# Patient Record
Sex: Female | Born: 2000 | Race: Black or African American | Hispanic: No | Marital: Single | Smoking: Never smoker
Health system: Southern US, Community
[De-identification: ages and names within clinical notes are randomized; demographics above are authoritative.]

## PROBLEM LIST (undated history)

## (undated) DIAGNOSIS — O24419 Gestational diabetes mellitus in pregnancy, unspecified control: Secondary | ICD-10-CM

## (undated) HISTORY — PX: OTHER SURGICAL HISTORY: SHX169

## (undated) HISTORY — DX: Gestational diabetes mellitus in pregnancy, unspecified control: O24.419

---
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
## 2019-01-31 NOTE — L&D Delivery Note (Signed)
° ° °     Delivery Note   Kathleen Barnes is a 19 y.o. G1P0 at 107w0d Estimated Date of Delivery: 11/25/19  PRE-OPERATIVE DIAGNOSIS:  1) [redacted]w[redacted]d pregnancy.  2) Spontaneous labor  POST-OPERATIVE DIAGNOSIS:  1) [redacted]w[redacted]d pregnancy s/p Vaginal, Spontaneous  2) Same with viable infant  Delivery Type: Vaginal, Spontaneous    Delivery Anesthesia: Other   Labor Complications:      ESTIMATED BLOOD LOSS: 150  ml    FINDINGS:   1) female infant, Apgar scores of    at 1 minute and    at 5 minutes and a birthweight of   ounces.    2) Nuchal cord: No  SPECIMENS:   PLACENTA:   Appearance:     Removal: Spontaneous      Disposition:    DISPOSITION:  Infant to left in stable condition in the delivery room, with L&D personnel and mother,  NARRATIVE SUMMARY: Labor course:  [name] is a G1P0 at [redacted]w[redacted]d who presented for labor management.  She progressed well in labor with pitocin.  After her contractions became more regular she proceeded to complete dilation. She evidenced good maternal expulsive effort during the second stage. She went on to deliver a viable infant. The placenta delivered without problems and was noted to be complete. A perineal and vaginal examination was performed. Episiotomy/Lacerations: None  The patient tolerated this well.  Elonda Husky, M.D. 11/04/2019 8:07 PM

## 2019-04-24 ENCOUNTER — Ambulatory Visit (INDEPENDENT_AMBULATORY_CARE_PROVIDER_SITE_OTHER): Payer: No Typology Code available for payment source | Admitting: Certified Nurse Midwife

## 2019-04-24 ENCOUNTER — Other Ambulatory Visit: Payer: Self-pay

## 2019-04-24 VITALS — BP 108/60 | HR 89 | Ht 59.0 in

## 2019-04-24 DIAGNOSIS — Z0283 Encounter for blood-alcohol and blood-drug test: Secondary | ICD-10-CM

## 2019-04-24 DIAGNOSIS — Z113 Encounter for screening for infections with a predominantly sexual mode of transmission: Secondary | ICD-10-CM

## 2019-04-24 DIAGNOSIS — Z3401 Encounter for supervision of normal first pregnancy, first trimester: Secondary | ICD-10-CM

## 2019-04-24 LAB — OB RESULTS CONSOLE VARICELLA ZOSTER ANTIBODY, IGG: Varicella: NON-IMMUNE/NOT IMMUNE

## 2019-04-24 LAB — OB RESULTS CONSOLE GC/CHLAMYDIA: Gonorrhea: NEGATIVE

## 2019-04-24 NOTE — Patient Instructions (Signed)
WHAT OB PATIENTS CAN EXPECT   Confirmation of pregnancy and ultrasound ordered if medically indicated-[redacted] weeks gestation  New OB (NOB) intake with nurse and New OB (NOB) labs- [redacted] weeks gestation  New OB (NOB) physical examination with provider- 11/[redacted] weeks gestation  Flu vaccine-[redacted] weeks gestation  Anatomy scan-[redacted] weeks gestation  Glucose tolerance test, blood work to test for anemia, T-dap vaccine-[redacted] weeks gestation  Vaginal swabs/cultures-STD/Group B strep-[redacted] weeks gestation  Appointments every 4 weeks until 28 weeks  Every 2 weeks from 28 weeks until 36 weeks  Weekly visits from 36 weeks until delivery  Second Trimester of Pregnancy  The second trimester is from week 14 through week 27 (month 4 through 6). This is often the time in pregnancy that you feel your best. Often times, morning sickness has lessened or quit. You may have more energy, and you may get hungry more often. Your unborn baby is growing rapidly. At the end of the sixth month, he or she is about 9 inches long and weighs about 1 pounds. You will likely feel the baby move between 18 and 20 weeks of pregnancy. Follow these instructions at home: Medicines  Take over-the-counter and prescription medicines only as told by your doctor. Some medicines are safe and some medicines are not safe during pregnancy.  Take a prenatal vitamin that contains at least 600 micrograms (mcg) of folic acid.  If you have trouble pooping (constipation), take medicine that will make your stool soft (stool softener) if your doctor approves. Eating and drinking   Eat regular, healthy meals.  Avoid raw meat and uncooked cheese.  If you get low calcium from the food you eat, talk to your doctor about taking a daily calcium supplement.  Avoid foods that are high in fat and sugars, such as fried and sweet foods.  If you feel sick to your stomach (nauseous) or throw up (vomit): ? Eat 4 or 5 small meals a day instead of 3 large  meals. ? Try eating a few soda crackers. ? Drink liquids between meals instead of during meals.  To prevent constipation: ? Eat foods that are high in fiber, like fresh fruits and vegetables, whole grains, and beans. ? Drink enough fluids to keep your pee (urine) clear or pale yellow. Activity  Exercise only as told by your doctor. Stop exercising if you start to have cramps.  Do not exercise if it is too hot, too humid, or if you are in a place of great height (high altitude).  Avoid heavy lifting.  Wear low-heeled shoes. Sit and stand up straight.  You can continue to have sex unless your doctor tells you not to. Relieving pain and discomfort  Wear a good support bra if your breasts are tender.  Take warm water baths (sitz baths) to soothe pain or discomfort caused by hemorrhoids. Use hemorrhoid cream if your doctor approves.  Rest with your legs raised if you have leg cramps or low back pain.  If you develop puffy, bulging veins (varicose veins) in your legs: ? Wear support hose or compression stockings as told by your doctor. ? Raise (elevate) your feet for 15 minutes, 3-4 times a day. ? Limit salt in your food. Prenatal care  Write down your questions. Take them to your prenatal visits.  Keep all your prenatal visits as told by your doctor. This is important. Safety  Wear your seat belt when driving.  Make a list of emergency phone numbers, including numbers for family, friends, the  hospital, and police and fire departments. General instructions  Ask your doctor about the right foods to eat or for help finding a counselor, if you need these services.  Ask your doctor about local prenatal classes. Begin classes before month 6 of your pregnancy.  Do not use hot tubs, steam rooms, or saunas.  Do not douche or use tampons or scented sanitary pads.  Do not cross your legs for long periods of time.  Visit your dentist if you have not done so. Use a soft toothbrush  to brush your teeth. Floss gently.  Avoid all smoking, herbs, and alcohol. Avoid drugs that are not approved by your doctor.  Do not use any products that contain nicotine or tobacco, such as cigarettes and e-cigarettes. If you need help quitting, ask your doctor.  Avoid cat litter boxes and soil used by cats. These carry germs that can cause birth defects in the baby and can cause a loss of your baby (miscarriage) or stillbirth. Contact a doctor if:  You have mild cramps or pressure in your lower belly.  You have pain when you pee (urinate).  You have bad smelling fluid coming from your vagina.  You continue to feel sick to your stomach (nauseous), throw up (vomit), or have watery poop (diarrhea).  You have a nagging pain in your belly area.  You feel dizzy. Get help right away if:  You have a fever.  You are leaking fluid from your vagina.  You have spotting or bleeding from your vagina.  You have severe belly cramping or pain.  You lose or gain weight rapidly.  You have trouble catching your breath and have chest pain.  You notice sudden or extreme puffiness (swelling) of your face, hands, ankles, feet, or legs.  You have not felt the baby move in over an hour.  You have severe headaches that do not go away when you take medicine.  You have trouble seeing. Summary  The second trimester is from week 14 through week 27 (months 4 through 6). This is often the time in pregnancy that you feel your best.  To take care of yourself and your unborn baby, you will need to eat healthy meals, take medicines only if your doctor tells you to do so, and do activities that are safe for you and your baby.  Call your doctor if you get sick or if you notice anything unusual about your pregnancy. Also, call your doctor if you need help with the right food to eat, or if you want to know what activities are safe for you. This information is not intended to replace advice given to you by  your health care provider. Make sure you discuss any questions you have with your health care provider. Document Revised: 05/10/2018 Document Reviewed: 02/22/2016 Elsevier Patient Education  [facility] [facility]. Morning Sickness  Morning sickness is when you feel sick to your stomach (nauseous) during pregnancy. You may feel sick to your stomach and throw up (vomit). You may feel sick in the morning, but you can feel this way at any time of day. Some women feel very sick to their stomach and cannot stop throwing up (hyperemesis gravidarum). Follow these instructions at home: Medicines  Take over-the-counter and prescription medicines only as told by your doctor. Do not take any medicines until you talk with your doctor about them first.  Taking multivitamins before getting pregnant can stop or lessen the harshness of morning sickness. Eating and drinking  Eat  dry toast or crackers before getting out of bed.  Eat 5 or 6 small meals a day.  Eat dry and bland foods like rice and baked potatoes.  Do not eat greasy, fatty, or spicy foods.  Have someone cook for you if the smell of food causes you to feel sick or throw up.  If you feel sick to your stomach after taking prenatal vitamins, take them at night or with a snack.  Eat protein when you need a snack. Nuts, yogurt, and cheese are good choices.  Drink fluids throughout the day.  Try ginger ale made with real ginger, ginger tea made from fresh grated ginger, or ginger candies. General instructions  Do not use any products that have nicotine or tobacco in them, such as cigarettes and e-cigarettes. If you need help quitting, ask your doctor.  Use an air purifier to keep the air in your house free of smells.  Get lots of fresh air.  Try to avoid smells that make you feel sick.  Try: ? Wearing a bracelet that is used for seasickness (acupressure wristband). ? Going to a doctor who puts thin needles into certain body points  (acupuncture) to improve how you feel. Contact a doctor if:  You need medicine to feel better.  You feel dizzy or light-headed.  You are losing weight. Get help right away if:  You feel very sick to your stomach and cannot stop throwing up.  You pass out (faint).  You have very bad pain in your belly. Summary  Morning sickness is when you feel sick to your stomach (nauseous) during pregnancy.  You may feel sick in the morning, but you can feel this way at any time of day.  Making some changes to what you eat may help your symptoms go away. This information is not intended to replace advice given to you by your health care provider. Make sure you discuss any questions you have with your health care provider. Document Revised: 12/29/2016 Document Reviewed: 02/17/2016 Elsevier Patient Education  2020 [employer]. How a Baby Grows During Pregnancy  Pregnancy begins when a female's sperm enters a female's egg (fertilization). Fertilization usually happens in one of the tubes (fallopian tubes) that connect the ovaries to the womb (uterus). The fertilized egg moves down the fallopian tube to the uterus. Once it reaches the uterus, it implants into the lining of the uterus and begins to grow. For the first 10 weeks, the fertilized egg is called an embryo. After 10 weeks, it is called a fetus. As the fetus continues to grow, it receives oxygen and nutrients through tissue (placenta) that grows to support the developing baby. The placenta is the life support system for the baby. It provides oxygen and nutrition and removes waste. Learning as much as you can about your pregnancy and how your baby is developing can help you enjoy the experience. It can also make you aware of when there might be a problem and when to ask questions. How long does a typical pregnancy last? A pregnancy usually lasts 280 days, or about 40 weeks. Pregnancy is divided into three periods of growth, also called  trimesters:  First trimester: 0-12 weeks.  Second trimester: 13-27 weeks.  Third trimester: 28-40 weeks. The day when your baby is ready to be born (full term) is your estimated date of delivery. How does my baby develop month by month? First month  The fertilized egg attaches to the inside of the uterus.  Some cells  will form the placenta. Others will form the fetus.  The arms, legs, brain, spinal cord, lungs, and heart begin to develop.  At the end of the first month, the heart begins to beat. Second month  The bones, inner ear, eyelids, hands, and feet form.  The genitals develop.  By the end of 8 weeks, all major organs are developing. Third month  All of the internal organs are forming.  Teeth develop below the gums.  Bones and muscles begin to grow. The spine can flex.  The skin is transparent.  Fingernails and toenails begin to form.  Arms and legs continue to grow longer, and hands and feet develop.  The fetus is about 3 inches (7.6 cm) long. Fourth month  The placenta is completely formed.  The external sex organs, neck, outer ear, eyebrows, eyelids, and fingernails are formed.  The fetus can hear, swallow, and move its arms and legs.  The kidneys begin to produce urine.  The skin is covered with a white, waxy coating (vernix) and very fine hair (lanugo). Fifth month  The fetus moves around more and can be felt for the first time (quickening).  The fetus starts to sleep and wake up and may begin to suck its finger.  The nails grow to the end of the fingers.  The organ in the digestive system that makes bile (gallbladder) functions and helps to digest nutrients.  If your baby is a girl, eggs are present in her ovaries. If your baby is a boy, testicles start to move down into his scrotum. Sixth month  The lungs are formed.  The eyes open. The brain continues to develop.  Your baby has fingerprints and toe prints. Your baby's hair grows  thicker.  At the end of the second trimester, the fetus is about 9 inches (22.9 cm) long. Seventh month  The fetus kicks and stretches.  The eyes are developed enough to sense changes in light.  The hands can make a grasping motion.  The fetus responds to sound. Eighth month  All organs and body systems are fully developed and functioning.  Bones harden, and taste buds develop. The fetus may hiccup.  Certain areas of the brain are still developing. The skull remains soft. Ninth month  The fetus gains about  lb (0.23 kg) each week.  The lungs are fully developed.  Patterns of sleep develop.  The fetus's head typically moves into a head-down position (vertex) in the uterus to prepare for birth.  The fetus weighs 6-9 lb (2.72-4.08 kg) and is 19-20 inches (48.26-50.8 cm) long. What can I do to have a healthy pregnancy and help my baby develop? General instructions  Take prenatal vitamins as directed by your health care provider. These include vitamins such as folic acid, iron, calcium, and vitamin D. They are important for healthy development.  Take medicines only as directed by your health care provider. Read labels and ask a pharmacist or your health care provider whether over-the-counter medicines, supplements, and prescription drugs are safe to take during pregnancy.  Keep all follow-up visits as directed by your health care provider. This is important. Follow-up visits include prenatal care and screening tests. How do I know if my baby is developing well? At each prenatal visit, your health care provider will do several different tests to check on your health and keep track of your baby's development. These include:  Fundal height and position. ? Your health care provider will measure your growing belly from your  pubic bone to the top of the uterus using a tape measure. ? Your health care provider will also feel your belly to determine your baby's  position.  Heartbeat. ? An ultrasound in the first trimester can confirm pregnancy and show a heartbeat, depending on how far along you are. ? Your health care provider will check your baby's heart rate at every prenatal visit.  Second trimester ultrasound. ? This ultrasound checks your baby's development. It also may show your baby's gender. What should I do if I have concerns about my baby's development? Always talk with your health care provider about any concerns that you may have about your pregnancy and your baby. Summary  A pregnancy usually lasts 280 days, or about 40 weeks. Pregnancy is divided into three periods of growth, also called trimesters.  Your health care provider will monitor your baby's growth and development throughout your pregnancy.  Follow your health care provider's recommendations about taking prenatal vitamins and medicines during your pregnancy.  Talk with your health care provider if you have any concerns about your pregnancy or your developing baby. This information is not intended to replace advice given to you by your health care provider. Make sure you discuss any questions you have with your health care provider. Document Revised: 05/09/2018 Document Reviewed: 11/29/2016 Elsevier Patient Education  2020 [place] of Pregnancy  The first trimester of pregnancy is from week 1 until the end of week 13 (months 1 through 3). During this time, your baby will begin to develop inside you. At 6-8 weeks, the eyes and face are formed, and the heartbeat can be seen on ultrasound. At the end of 12 weeks, all the baby's organs are formed. Prenatal care is all the medical care you receive before the birth of your baby. Make sure you get good prenatal care and follow all of your doctor's instructions. Follow these instructions at home: Medicines  Take over-the-counter and prescription medicines only as told by your doctor. Some medicines are safe  and some medicines are not safe during pregnancy.  Take a prenatal vitamin that contains at least 600 micrograms (mcg) of folic acid.  If you have trouble pooping (constipation), take medicine that will make your stool soft (stool softener) if your doctor approves. Eating and drinking   Eat regular, healthy meals.  Your doctor will tell you the amount of weight gain that is right for you.  Avoid raw meat and uncooked cheese.  If you feel sick to your stomach (nauseous) or throw up (vomit): ? Eat 4 or 5 small meals a day instead of 3 large meals. ? Try eating a few soda crackers. ? Drink liquids between meals instead of during meals.  To prevent constipation: ? Eat foods that are high in fiber, like fresh fruits and vegetables, whole grains, and beans. ? Drink enough fluids to keep your pee (urine) clear or pale yellow. Activity  Exercise only as told by your doctor. Stop exercising if you have cramps or pain in your lower belly (abdomen) or low back.  Do not exercise if it is too hot, too humid, or if you are in a place of great height (high altitude).  Try to avoid standing for long periods of time. Move your legs often if you must stand in one place for a long time.  Avoid heavy lifting.  Wear low-heeled shoes. Sit and stand up straight.  You can have sex unless your doctor tells you not to. Relieving pain  and discomfort  Wear a good support bra if your breasts are sore.  Take warm water baths (sitz baths) to soothe pain or discomfort caused by hemorrhoids. Use hemorrhoid cream if your doctor says it is okay.  Rest with your legs raised if you have leg cramps or low back pain.  If you have puffy, bulging veins (varicose veins) in your legs: ? Wear support hose or compression stockings as told by your doctor. ? Raise (elevate) your feet for 15 minutes, 3-4 times a day. ? Limit salt in your food. Prenatal care  Schedule your prenatal visits by the twelfth week of  pregnancy.  Write down your questions. Take them to your prenatal visits.  Keep all your prenatal visits as told by your doctor. This is important. Safety  Wear your seat belt at all times when driving.  Make a list of emergency phone numbers. The list should include numbers for family, friends, the hospital, and police and fire departments. General instructions  Ask your doctor for a referral to a local prenatal class. Begin classes no later than at the start of month 6 of your pregnancy.  Ask for help if you need counseling or if you need help with nutrition. Your doctor can give you advice or tell you where to go for help.  Do not use hot tubs, steam rooms, or saunas.  Do not douche or use tampons or scented sanitary pads.  Do not cross your legs for long periods of time.  Avoid all herbs and alcohol. Avoid drugs that are not approved by your doctor.  Do not use any tobacco products, including cigarettes, chewing tobacco, and electronic cigarettes. If you need help quitting, ask your doctor. You may get counseling or other support to help you quit.  Avoid cat litter boxes and soil used by cats. These carry germs that can cause birth defects in the baby and can cause a loss of your baby (miscarriage) or stillbirth.  Visit your dentist. At home, brush your teeth with a soft toothbrush. Be gentle when you floss. Contact a doctor if:  You are dizzy.  You have mild cramps or pressure in your lower belly.  You have a nagging pain in your belly area.  You continue to feel sick to your stomach, you throw up, or you have watery poop (diarrhea).  You have a bad smelling fluid coming from your vagina.  You have pain when you pee (urinate).  You have increased puffiness (swelling) in your face, hands, legs, or ankles. Get help right away if:  You have a fever.  You are leaking fluid from your vagina.  You have spotting or bleeding from your vagina.  You have very bad belly  cramping or pain.  You gain or lose weight rapidly.  You throw up blood. It may look like coffee grounds.  You are around people who have [place] measles, fifth disease, or chickenpox.  You have a very bad headache.  You have shortness of breath.  You have any kind of trauma, such as from a fall or a car accident. Summary  The first trimester of pregnancy is from week 1 until the end of week 13 (months 1 through 3).  To take care of yourself and your unborn baby, you will need to eat healthy meals, take medicines only if your doctor tells you to do so, and do activities that are safe for you and your baby.  Keep all follow-up visits as told by your  doctor. This is important as your doctor will have to ensure that your baby is healthy and growing well. This information is not intended to replace advice given to you by your health care provider. Make sure you discuss any questions you have with your health care provider. Document Revised: 05/09/2018 Document Reviewed: 01/25/2016 Elsevier Patient Education  2020 [employer]. Commonly Asked Questions During Pregnancy  Cats: A parasite can be excreted in cat feces.  To avoid exposure you need to have another person empty the little box.  If you must empty the litter box you will need to wear gloves.  Wash your hands after handling your cat.  This parasite can also be found in raw or undercooked meat so this should also be avoided.  Colds, Sore Throats, Flu: Please check your medication sheet to see what you can take for symptoms.  If your symptoms are unrelieved by these medications please call the office.  Dental Work: Most any dental work [employer] recommends is permitted.  X-rays should only be taken during the first trimester if absolutely necessary.  Your abdomen should be shielded with a lead apron during all x-rays.  Please notify your provider prior to receiving any x-rays.  Novocaine is fine; gas is not recommended.  If your  dentist requires a note from [place] prior to dental work please call the office and we will provide one for you.  Exercise: Exercise is an important part of staying healthy during your pregnancy.  You may continue most exercises you were accustomed to prior to pregnancy.  Later in your pregnancy you will most likely notice you have difficulty with activities requiring balance like riding a bicycle.  It is important that you listen to your body and avoid activities that put you at a higher risk of falling.  Adequate rest and staying well hydrated are a must!  If you have questions about the safety of specific activities ask your provider.    Exposure to Children with illness: Try to avoid obvious exposure; report any symptoms to [place] when noted,  If you have chicken pos, red measles or mumps, you should be immune to these diseases.   Please do not take any vaccines while pregnant unless you have checked with your OB provider.  Fetal Movement: After 28 weeks we recommend you do "kick counts" twice daily.  Lie or sit down in a calm quiet environment and count your baby movements "kicks".  You should feel your baby at least 10 times per hour.  If you have not felt 10 kicks within the first hour get up, walk around and have something sweet to eat or drink then repeat for an additional hour.  If count remains less than 10 per hour notify your provider.  Fumigating: Follow your pest control agent's advice as to how long to stay out of your home.  Ventilate the area well before re-entering.  Hemorrhoids:   Most over-the-counter preparations can be used during pregnancy.  Check your medication to see what is safe to use.  It is important to use a stool softener or fiber in your diet and to drink lots of liquids.  If hemorrhoids seem to be getting worse please call the office.   Hot Tubs:  Hot tubs Jacuzzis and saunas are not recommended while pregnant.  These increase your internal body temperature and should be  avoided.  Intercourse:  Sexual intercourse is safe during pregnancy as long as you are comfortable, unless otherwise advised by  your provider.  Spotting may occur after intercourse; report any bright red bleeding that is heavier than spotting.  Labor:  If you know that you are in labor, please go to the hospital.  If you are unsure, please call the office and let us help you decide what to do.  Lifting, straining, etc:  If your job requires heavy lifting or straining please check with your provider for any limitations.  Generally, you should not lift items heavier than that you can lift simply with your hands and arms (no back muscles)  Painting:  Paint fumes do not harm your pregnancy, but may make you ill and should be avoided if possible.  Latex or water based paints have less odor than oils.  Use adequate ventilation while painting.  Permanents & Hair Color:  Chemicals in hair dyes are not recommended as they cause increase hair dryness which can increase hair loss during pregnancy.  " Highlighting" and permanents are allowed.  Dye may be absorbed differently and permanents may not hold as well during pregnancy.  Sunbathing:  Use a sunscreen, as skin burns easily during pregnancy.  Drink plenty of fluids; avoid over heating.  Tanning Beds:  Because their possible side effects are still unknown, tanning beds are not recommended.  Ultrasound Scans:  Routine ultrasounds are performed at approximately 20 weeks.  You will be able to see your baby's general anatomy an if you would like to know the gender this can usually be determined as well.  If it is questionable when you conceived you may also receive an ultrasound early in your pregnancy for dating purposes.  Otherwise ultrasound exams are not routinely performed unless there is a medical necessity.  Although you can request a scan we ask that you pay for it when conducted because insurance does not cover " patient request" scans.  Work: If your  pregnancy proceeds without complications you may work until your due date, unless your physician or employer advises otherwise.  Round Ligament Pain/Pelvic Discomfort:  Sharp, shooting pains not associated with bleeding are fairly common, usually occurring in the second trimester of pregnancy.  They tend to be worse when standing up or when you remain standing for long periods of time.  These are the result of pressure of certain pelvic ligaments called "round ligaments".  Rest, Tylenol and heat seem to be the most effective relief.  As the womb and fetus grow, they rise out of the pelvis and the discomfort improves.  Please notify the office if your pain seems different than that described.  It may represent a more serious condition.  Common Medications Safe in Pregnancy  Acne:      Constipation:  Benzoyl Peroxide     Colace  Clindamycin      Dulcolax Suppository  Topica Erythromycin     Fibercon  Salicylic Acid      Metamucil         Miralax AVOID:        Senakot   Accutane    Cough:  Retin-A       Cough Drops  Tetracycline      Phenergan w/ Codeine if Rx  Minocycline      Robitussin (Plain & DM)  Antibiotics:     Crabs/Lice:  Ceclor       RID  Cephalosporins    AVOID:  E-Mycins      Kwell  Keflex  Macrobid/Macrodantin   Diarrhea:  Penicillin      Kao-Pectate  Zithromax  Imodium AD         PUSH FLUIDS AVOID:       Cipro     Fever:  Tetracycline      Tylenol (Regular or Extra  Minocycline       Strength)  Levaquin      Extra Strength-Do not          Exceed 8 tabs/24 hrs Caffeine:        '200mg'$ /day (equiv. To 1 cup of coffee or  approx. 3 12 oz sodas)         Gas: Cold/Hayfever:       Gas-X  Benadryl      Mylicon  Claritin       Phazyme  **Claritin-D        Chlor-Trimeton    Headaches:  Dimetapp      ASA-Free Excedrin  Drixoral-Non-Drowsy     Cold Compress  Mucinex (Guaifenasin)     Tylenol (Regular or Extra  Sudafed/Sudafed-12 Hour     Strength)  **Sudafed PE  Pseudoephedrine   Tylenol Cold & Sinus     Vicks Vapor Rub  Zyrtec  **AVOID if Problems With Blood Pressure         Heartburn: Avoid lying down for at least 1 hour after meals  Aciphex      Maalox     Rash:  Milk of Magnesia     Benadryl    Mylanta       1% Hydrocortisone Cream  Pepcid  Pepcid Complete   Sleep Aids:  Prevacid      Ambien   Prilosec       Benadryl  Rolaids       Chamomile Tea  Tums (Limit 4/day)     Unisom  Zantac       Tylenol PM         Warm milk-add vanilla or  Hemorrhoids:       Sugar for taste  Anusol/Anusol H.C.  (RX: Analapram 2.5%)  Sugar Substitutes:  Hydrocortisone OTC     Ok in moderation  Preparation H      Tucks        Vaseline lotion applied to tissue with wiping    Herpes:     Throat:  Acyclovir      Oragel  Famvir  Valtrex     Vaccines:         Flu Shot Leg Cramps:       *Gardasil  Benadryl      Hepatitis A         Hepatitis B Nasal Spray:       Pneumovax  Saline Nasal Spray     Polio Booster         Tetanus Nausea:       Tuberculosis test or PPD  Vitamin B6 25 mg TID   AVOID:    Dramamine      *Gardasil  Emetrol       Live Poliovirus  Ginger Root 250 mg QID    MMR (measles, mumps &  High Complex Carbs @ Bedtime    rebella)  Sea Bands-Accupressure    Varicella (Chickenpox)  Unisom 1/2 tab TID     *No known complications           If received before Pain:         Known pregnancy;   Darvocet       Resume series after  Lortab        Delivery  Percocet  Yeast:   Tramadol      Femstat  Tylenol 3      Gyne-lotrimin  Ultram       Monistat  Vicodin           MISC:         All Sunscreens           Hair Coloring/highlights          Insect Repellant's          (Including DEET)         Mystic Tans

## 2019-04-24 NOTE — Progress Notes (Signed)
      Kathleen Barnes presents for NOB nurse intake visit. Pregnancy confirmation done at [facility],04/09/2019, with unknown.  G1.  P0.  LMP 02/18/2019.  EDD10/26/2021.  Ga [redacted]w[redacted]d. Pregnancy education material explained and given.  0 cats in the home.  NOB labs ordered. BMI less than 30. TSH/HbgA1c not ordered. Sickle cell order due to race. HIV and drug screen explained and ordered. Genetic screening discussed. Genetic testing; Unsure would like to talk it over with family. Pt to discuss genetic testing with provider. PNV encouraged. Pt to follow up with provider in 3 weeks for NOB physical and this week for u/s dating and viability.   BP 108/60   Pulse 89   Ht 4\' 11"  (1.499 m)   LMP 02/18/2019   BMI 29.29 kg/m

## 2019-04-24 NOTE — Progress Notes (Signed)
I have reviewed the record and concur with patient management and plan of care.    Gunnar Bulla, CNM [facility], [facility] 04/24/19 1:55 PM

## 2019-04-25 LAB — URINALYSIS, ROUTINE W REFLEX MICROSCOPIC
Bilirubin, UA: NEGATIVE
Glucose, UA: NEGATIVE
Ketones, UA: NEGATIVE
Leukocytes,UA: NEGATIVE
Nitrite, UA: NEGATIVE
Protein,UA: NEGATIVE
RBC, UA: NEGATIVE
Specific Gravity, UA: >=1.03 — AB (ref 1.005–1.030)
Urobilinogen, Ur: 0.2 mg/dL (ref 0.2–1.0)
pH, UA: 6 (ref 5.0–7.5)

## 2019-04-25 LAB — ANTIBODY SCREEN: Antibody Screen: NEGATIVE

## 2019-04-25 LAB — RUBELLA SCREEN: Rubella Antibodies, IGG: 1.48 index (ref >0.99)

## 2019-04-25 LAB — RPR: RPR Ser Ql: NONREACTIVE

## 2019-04-25 LAB — HEPATITIS B SURFACE ANTIGEN: Hepatitis B Surface Ag: NEGATIVE

## 2019-04-25 LAB — ABO AND RH: Rh Factor: POSITIVE

## 2019-04-25 LAB — HIV ANTIBODY (ROUTINE TESTING W REFLEX): HIV Screen 4th Generation wRfx: NONREACTIVE

## 2019-04-25 LAB — HGB SOLU + RFLX FRAC: Sickle Solubility Test - HGBRFX: NEGATIVE

## 2019-04-25 LAB — TOXOPLASMA ANTIBODIES- IGG AND  IGM
Toxoplasma Antibody- IgM: <3 AU/mL (ref 0.0–7.9)
Toxoplasma IgG Ratio: <3 IU/mL (ref 0.0–7.1)

## 2019-04-25 LAB — VARICELLA ZOSTER ANTIBODY, IGG: Varicella zoster IgG: <135 index — ABNORMAL LOW (ref >165)

## 2019-04-26 LAB — DRUG PROFILE, UR, 9 DRUGS (LABCORP)
Amphetamines, Urine: NEGATIVE ng/mL
Barbiturate Quant, Ur: NEGATIVE ng/mL
Benzodiazepine Quant, Ur: NEGATIVE ng/mL
Cannabinoid Quant, Ur: NEGATIVE ng/mL
Cocaine (Metab.): NEGATIVE ng/mL
Methadone Screen, Urine: NEGATIVE ng/mL
Opiate Quant, Ur: NEGATIVE ng/mL
PCP Quant, Ur: NEGATIVE ng/mL
Propoxyphene: NEGATIVE ng/mL

## 2019-04-26 LAB — NICOTINE SCREEN, URINE: Cotinine Ql Scrn, Ur: POSITIVE ng/mL — AB

## 2019-04-26 LAB — CULTURE, OB URINE

## 2019-04-26 LAB — URINE CULTURE, OB REFLEX

## 2019-04-27 ENCOUNTER — Encounter: Payer: Self-pay | Admitting: Obstetrics and Gynecology

## 2019-04-27 LAB — GC/CHLAMYDIA PROBE AMP
Chlamydia trachomatis, NAA: NEGATIVE
Neisseria Gonorrhoeae by PCR: NEGATIVE

## 2019-05-15 ENCOUNTER — Other Ambulatory Visit: Payer: Self-pay

## 2019-05-15 ENCOUNTER — Ambulatory Visit (INDEPENDENT_AMBULATORY_CARE_PROVIDER_SITE_OTHER): Payer: No Typology Code available for payment source

## 2019-05-15 DIAGNOSIS — Z3401 Encounter for supervision of normal first pregnancy, first trimester: Secondary | ICD-10-CM

## 2019-05-16 ENCOUNTER — Ambulatory Visit (INDEPENDENT_AMBULATORY_CARE_PROVIDER_SITE_OTHER): Payer: No Typology Code available for payment source | Admitting: Certified Nurse Midwife

## 2019-05-16 ENCOUNTER — Encounter: Payer: Self-pay | Admitting: Certified Nurse Midwife

## 2019-05-16 VITALS — BP 112/65 | HR 103 | Wt 146.5 lb

## 2019-05-16 DIAGNOSIS — Z3401 Encounter for supervision of normal first pregnancy, first trimester: Secondary | ICD-10-CM

## 2019-05-16 DIAGNOSIS — O09899 Supervision of other high risk pregnancies, unspecified trimester: Secondary | ICD-10-CM

## 2019-05-16 DIAGNOSIS — L818 Other specified disorders of pigmentation: Secondary | ICD-10-CM

## 2019-05-16 DIAGNOSIS — Z283 Underimmunization status: Secondary | ICD-10-CM

## 2019-05-16 DIAGNOSIS — Z2839 Other underimmunization status: Secondary | ICD-10-CM | POA: Insufficient documentation

## 2019-05-16 DIAGNOSIS — Z3A12 12 weeks gestation of pregnancy: Secondary | ICD-10-CM

## 2019-05-16 DIAGNOSIS — Z130 Encounter for screening for diseases of the blood and blood-forming organs and certain disorders involving the immune mechanism: Secondary | ICD-10-CM

## 2019-05-16 HISTORY — DX: Supervision of other high risk pregnancies, unspecified trimester: O09.899

## 2019-05-16 HISTORY — DX: Other underimmunization status: Z28.39

## 2019-05-16 LAB — POCT URINALYSIS DIPSTICK OB
Bilirubin, UA: NEGATIVE
Blood, UA: NEGATIVE
Glucose, UA: NEGATIVE
Ketones, UA: NEGATIVE
Leukocytes, UA: NEGATIVE
Nitrite, UA: NEGATIVE
POC,PROTEIN,UA: NEGATIVE
Spec Grav, UA: >=1.03 — AB (ref 1.010–1.025)
Urobilinogen, UA: 0.2 E.U./dL
pH, UA: 5 (ref 5.0–8.0)

## 2019-05-16 NOTE — Patient Instructions (Signed)
Healthy Weight Gain During Pregnancy, Adult A certain amount of weight gain during pregnancy is normal and healthy. How much weight you should gain depends on your overall health and a measurement called BMI (body mass index). BMI is an estimate of your body fat based on your height and weight. You can use an online calculator to figure out your BMI, or you can ask your health care provider to calculate it for you at your next visit. Your recommended pregnancy weight gain is based on your pre-pregnancy BMI. General guidelines for a healthy total weight gain during pregnancy are listed below. If your BMI at or before the start of your pregnancy is:  Less than 18.5 (underweight), you should gain 28-40 lb (13-18 kg).  18.5-24.9 (normal weight), you should gain 25-35 lb (11-16 kg).  25-29.9 (overweight), you should gain 15-25 lb (7-11 kg).  30 or higher (obese), you should gain 11-20 lb (5-9 kg). These ranges vary depending on your individual health. If you are carrying more than one baby (multiples), it may be safe to gain more weight than these recommendations. If you gain less weight than recommended, that may be safe as long as your baby is growing and developing normally. How can unhealthy weight gain affect me and my baby? Gaining too much weight during pregnancy can lead to pregnancy complications, such as:  A temporary form of diabetes that develops during pregnancy (gestational diabetes).  High blood pressure during pregnancy and protein in your urine (preeclampsia).  High blood pressure during pregnancy without protein in your urine (gestational hypertension).  Your baby having a high weight at birth, which may: ? Raise your risk of having a more difficult delivery or a surgical delivery (cesarean delivery, or C-section). ? Raise your child's risk of developing obesity during childhood. Not gaining enough weight can be life-threatening for your baby, and it may raise your baby's chances  of:  Being born early (preterm).  Growing more slowly than normal during pregnancy (growth restriction).  Having a low weight at birth. What actions can I take to gain a healthy amount of weight during pregnancy? General instructions  Keep track of your weight gain during pregnancy.  Take over-the-counter and prescription medicines only as told by your health care provider. Take all prenatal supplements as directed.  Keep all health care visits during pregnancy (prenatal visits). These visits are a good time to discuss your weight gain. Your health care provider will weigh you at each visit to make sure you are gaining a healthy amount of weight. Nutrition   Eat a balanced, nutrient-rich diet. Eat plenty of: ? Fruits and vegetables, such as berries and broccoli. ? Whole grains, such as millet, barley, whole-wheat breads and cereals, and oatmeal. ? Low-fat dairy products or non-dairy products such as almond milk or rice milk. ? Protein foods, such as lean meat, chicken, eggs, and legumes (such as peas, beans, soybeans, and lentils).  Avoid foods that are fried or have a lot of fat, salt (sodium), or sugar.  Drink enough fluid to keep your urine pale yellow.  Choose healthy snack and drink options when you are at work or on the go: ? Drink water. Avoid soda, sports drinks, and juices that have added sugar. ? Avoid drinks with caffeine, such as coffee and energy drinks. ? Eat snacks that are high in protein, such as nuts, protein bars, and low-fat yogurt. ? Carry convenient snacks in your purse that do not need refrigeration, such as a pack of   trail mix, an apple, or a granola bar.  If you need help improving your diet, work with a health care provider or a diet and nutrition specialist (dietitian). Activity   Exercise regularly, as told by your health care provider. ? If you were active before becoming pregnant, you may be able to continue your regular fitness activities. ? If  you were not active before pregnancy, you may gradually build up to exercising for 30 or more minutes on most days of the week. This may include walking, swimming, or yoga.  Ask your health care provider what activities are safe for you. Talk with your health care provider about whether you may need to be excused from certain school or work activities. Where to find more information Learn more about managing your weight gain during pregnancy from:  American Pregnancy Association: www.americanpregnancy.org  [place] Department of Agriculture pregnancy weight gain calculator: [employer] Summary  Too much weight gain during pregnancy can lead to complications for you and your baby.  Find out your pre-pregnancy BMI to determine how much weight gain is healthy for you.  Eat nutritious foods and stay active.  Keep all of your prenatal visits as told by your health care provider. This information is not intended to replace advice given to you by your health care provider. Make sure you discuss any questions you have with your health care provider. Document Revised: 10/09/2018 Document Reviewed: 10/06/2016 Elsevier Patient Education  [place].   Exercise During Pregnancy Exercise is an important part of being healthy for people of all ages. Exercise improves the function of your heart and lungs and helps you maintain strength, flexibility, and a healthy body weight. Exercise also boosts energy levels and elevates mood. Most women should exercise regularly during pregnancy. In rare cases, women with certain medical conditions or complications may be asked to limit or avoid exercise during pregnancy. How does this affect me? Along with maintaining general strength and flexibility, exercising during pregnancy can help:  Keep strength in muscles that are used during labor and childbirth.  Decrease low back pain.  Reduce symptoms of depression.  Control weight gain during  pregnancy.  Reduce the risk of needing insulin if you develop diabetes during pregnancy.  Decrease the risk of cesarean delivery.  Speed up your recovery after giving birth. How does this affect my baby? Exercise can help you have a healthy pregnancy. Exercise does not cause premature birth. It will not cause your baby to weigh less at birth. What exercises can I do? Many exercises are safe for you to do during pregnancy. Do a variety of exercises that safely increase your heart and breathing rates and help you build and maintain muscle strength. Do exercises exactly as told by your health care provider. You may do these exercises:  Walking or hiking.  Swimming.  Water aerobics.  Riding a stationary bike.  Strength training.  Modified yoga or Pilates. Tell your instructor that you are pregnant. Avoid overstretching, and avoid lying on your back for long periods of time.  Running or jogging. Only choose this type of exercise if you: ? Ran or jogged regularly before your pregnancy. ? Can run or jog and still talk in complete sentences. What exercises should I avoid? Depending on your level of fitness and whether you exercised regularly before your pregnancy, you may be told to limit high-intensity exercise. You can tell that you are exercising at a high intensity if you are breathing much harder and faster and  cannot hold a conversation while exercising. You must avoid:  Contact sports.  Activities that put you at risk for falling on or being hit in the belly, such as downhill skiing, water skiing, surfing, rock climbing, cycling, gymnastics, and horseback riding.  Scuba diving.  Skydiving.  Yoga or Pilates in a room that is heated to high temperatures.  Jogging or running, unless you ran or jogged regularly before your pregnancy. While jogging or running, you should always be able to talk in full sentences. Do not run or jog so fast that you are unable to have a  conversation.  Do not exercise at more than 6,000 feet above sea level (high elevation) if you are not used to exercising at high elevation. How do I exercise in a safe way?   Avoid overheating. Do not exercise in very high temperatures.  Wear loose-fitting, breathable clothes.  Avoid dehydration. Drink enough water before, during, and after exercise to keep your urine pale yellow.  Avoid overstretching. Because of hormone changes during pregnancy, it is easy to overstretch muscles, tendons, and ligaments during pregnancy.  Start slowly and ask your health care provider to recommend the types of exercise that are safe for you.  Do not exercise to lose weight. Follow these instructions at home:  Exercise on most days or all days of the week. Try to exercise for 30 minutes a day, 5 days a week, unless your health care provider tells you not to.  If you actively exercised before your pregnancy and you are healthy, your health care provider may tell you to continue to do moderate to high-intensity exercise.  If you are just starting to exercise or did not exercise much before your pregnancy, your health care provider may tell you to do low to moderate-intensity exercise. Questions to ask your health care provider  Is exercise safe for me?  What are signs that I should stop exercising?  Does my health condition mean that I should not exercise during pregnancy?  When should I avoid exercising during pregnancy? Stop exercising and contact a health care provider if: You have any unusual symptoms, such as:  Mild contractions of the uterus or cramps in the abdomen.  Dizziness that does not go away when you rest. Stop exercising and get help right away if: You have any unusual symptoms, such as:  Sudden, severe pain in your low back or your belly.  Mild contractions of the uterus or cramps in the abdomen that do not improve with rest and drinking fluids.  Chest pain.  Bleeding or  fluid leaking from your vagina.  Shortness of breath. These symptoms may represent a serious problem that is an emergency. Do not wait to see if the symptoms will go away. Get medical help right away. Call your local emergency services (911 in the [place] Do not drive yourself to the hospital. Summary  Most women should exercise regularly throughout pregnancy. In rare cases, women with certain medical conditions or complications may be asked to limit or avoid exercise during pregnancy.  Do not exercise to lose weight during pregnancy.  Your health care provider will tell you what level of physical activity is right for you.  Stop exercising and contact a health care provider if you have mild contractions of the uterus or cramps in the abdomen. Get help right away if these contractions or cramps do not improve with rest and drinking fluids.  Stop exercising and get help right away if you have sudden, severe  pain in your low back or belly, chest pain, shortness of breath, or bleeding or leaking of fluid from your vagina. This information is not intended to replace advice given to you by your health care provider. Make sure you discuss any questions you have with your health care provider. Document Revised: 05/09/2018 Document Reviewed: 02/20/2018 Elsevier Patient Education  [place].   Common Medications Safe in Pregnancy  Acne:      Constipation:  Benzoyl Peroxide     Colace  Clindamycin      Dulcolax Suppository  Topica Erythromycin     Fibercon  Salicylic Acid      Metamucil         Miralax AVOID:        Senakot   Accutane    Cough:  Retin-A       Cough Drops  Tetracycline      Phenergan w/ Codeine if Rx  Minocycline      Robitussin (Plain & DM)  Antibiotics:     Crabs/Lice:  Ceclor       RID  Cephalosporins    AVOID:  E-Mycins      Kwell  Keflex  Macrobid/Macrodantin   Diarrhea:  Penicillin      Kao-Pectate  Zithromax      Imodium AD         PUSH  FLUIDS AVOID:       Cipro     Fever:  Tetracycline      Tylenol (Regular or Extra  Minocycline       Strength)  Levaquin      Extra Strength-Do not          Exceed 8 tabs/24 hrs Caffeine:        <248m/day (equiv. To 1 cup of coffee or  approx. 3 12 oz sodas)         Gas: Cold/Hayfever:       Gas-X  Benadryl      Mylicon  Claritin       Phazyme  **Claritin-D        Chlor-Trimeton    Headaches:  Dimetapp      ASA-Free Excedrin  Drixoral-Non-Drowsy     Cold Compress  Mucinex (Guaifenasin)     Tylenol (Regular or Extra  Sudafed/Sudafed-12 Hour     Strength)  **Sudafed PE Pseudoephedrine   Tylenol Cold & Sinus     Vicks Vapor Rub  Zyrtec  **AVOID if Problems With Blood Pressure         Heartburn: Avoid lying down for at least 1 hour after meals  Aciphex      Maalox     Rash:  Milk of Magnesia     Benadryl    Mylanta       1% Hydrocortisone Cream  Pepcid  Pepcid Complete   Sleep Aids:  Prevacid      Ambien   Prilosec       Benadryl  Rolaids       Chamomile Tea  Tums (Limit 4/day)     Unisom  Zantac       Tylenol PM         Warm milk-add vanilla or  Hemorrhoids:       Sugar for taste  Anusol/Anusol H.C.  (RX: Analapram 2.5%)  Sugar Substitutes:  Hydrocortisone OTC     Ok in moderation  Preparation H      Tucks        Vaseline lotion applied to tissue with wiping  Herpes:     Throat:  Acyclovir      Oragel  Famvir  Valtrex     Vaccines:         Flu Shot Leg Cramps:       *Gardasil  Benadryl      Hepatitis A         Hepatitis B Nasal Spray:       Pneumovax  Saline Nasal Spray     Polio Booster         Tetanus Nausea:       Tuberculosis test or PPD  Vitamin B6 25 mg TID   AVOID:    Dramamine      *Gardasil  Emetrol       Live Poliovirus  Ginger Root 250 mg QID    MMR (measles, mumps &  High Complex Carbs @ Bedtime    rebella)  Sea Bands-Accupressure    Varicella (Chickenpox)  Unisom 1/2 tab TID     *No known complications           If received  before Pain:         Known pregnancy;   Darvocet       Resume series after  Lortab        Delivery  Percocet    Yeast:   Tramadol      Femstat  Tylenol 3      Gyne-lotrimin  Ultram       Monistat  Vicodin           MISC:         All Sunscreens           Hair Coloring/highlights          Insect Repellant's          (Including DEET)         Mystic Tans    Second Trimester of Pregnancy  The second trimester is from week 14 through week 27 (month 4 through 6). This is often the time in pregnancy that you feel your best. Often times, morning sickness has lessened or quit. You may have more energy, and you may get hungry more often. Your unborn baby is growing rapidly. At the end of the sixth month, he or she is about 9 inches long and weighs about 1 pounds. You will likely feel the baby move between 18 and 20 weeks of pregnancy. Follow these instructions at home: Medicines  Take over-the-counter and prescription medicines only as told by your doctor. Some medicines are safe and some medicines are not safe during pregnancy.  Take a prenatal vitamin that contains at least 600 micrograms (mcg) of folic acid.  If you have trouble pooping (constipation), take medicine that will make your stool soft (stool softener) if your doctor approves. Eating and drinking   Eat regular, healthy meals.  Avoid raw meat and uncooked cheese.  If you get low calcium from the food you eat, talk to your doctor about taking a daily calcium supplement.  Avoid foods that are high in fat and sugars, such as fried and sweet foods.  If you feel sick to your stomach (nauseous) or throw up (vomit): ? Eat 4 or 5 small meals a day instead of 3 large meals. ? Try eating a few soda crackers. ? Drink liquids between meals instead of during meals.  To prevent constipation: ? Eat foods that are high in fiber, like fresh fruits and vegetables, whole grains, and beans. ? Drink enough fluids   keep your pee (urine)  clear or pale yellow. Activity  Exercise only as told by your doctor. Stop exercising if you start to have cramps.  Do not exercise if it is too hot, too humid, or if you are in a place of great height (high altitude).  Avoid heavy lifting.  Wear low-heeled shoes. Sit and stand up straight.  You can continue to have sex unless your doctor tells you not to. Relieving pain and discomfort  Wear a good support bra if your breasts are tender.  Take warm water baths (sitz baths) to soothe pain or discomfort caused by hemorrhoids. Use hemorrhoid cream if your doctor approves.  Rest with your legs raised if you have leg cramps or low back pain.  If you develop puffy, bulging veins (varicose veins) in your legs: ? Wear support hose or compression stockings as told by your doctor. ? Raise (elevate) your feet for 15 minutes, 3-4 times a day. ? Limit salt in your food. Prenatal care  Write down your questions. Take them to your prenatal visits.  Keep all your prenatal visits as told by your doctor. This is important. Safety  Wear your seat belt when driving.  Make a list of emergency phone numbers, including numbers for family, friends, the hospital, and police and fire departments. General instructions  Ask your doctor about the right foods to eat or for help finding a counselor, if you need these services.  Ask your doctor about local prenatal classes. Begin classes before month 6 of your pregnancy.  Do not use hot tubs, steam rooms, or saunas.  Do not douche or use tampons or scented sanitary pads.  Do not cross your legs for long periods of time.  Visit your dentist if you have not done so. Use a soft toothbrush to brush your teeth. Floss gently.  Avoid all smoking, herbs, and alcohol. Avoid drugs that are not approved by your doctor.  Do not use any products that contain nicotine or tobacco, such as cigarettes and e-cigarettes. If you need help quitting, ask your  doctor.  Avoid cat litter boxes and soil used by cats. These carry germs that can cause birth defects in the baby and can cause a loss of your baby (miscarriage) or stillbirth. Contact a doctor if:  You have mild cramps or pressure in your lower belly.  You have pain when you pee (urinate).  You have bad smelling fluid coming from your vagina.  You continue to feel sick to your stomach (nauseous), throw up (vomit), or have watery poop (diarrhea).  You have a nagging pain in your belly area.  You feel dizzy. Get help right away if:  You have a fever.  You are leaking fluid from your vagina.  You have spotting or bleeding from your vagina.  You have severe belly cramping or pain.  You lose or gain weight rapidly.  You have trouble catching your breath and have chest pain.  You notice sudden or extreme puffiness (swelling) of your face, hands, ankles, feet, or legs.  You have not felt the baby move in over an hour.  You have severe headaches that do not go away when you take medicine.  You have trouble seeing. Summary  The second trimester is from week 14 through week 27 (months 4 through 6). This is often the time in pregnancy that you feel your best.  To take care of yourself and your unborn baby, you will need to eat healthy meals, take  medicines only if your doctor tells you to do so, and do activities that are safe for you and your baby.  Call your doctor if you get sick or if you notice anything unusual about your pregnancy. Also, call your doctor if you need help with the right food to eat, or if you want to know what activities are safe for you. This information is not intended to replace advice given to you by your health care provider. Make sure you discuss any questions you have with your health care provider. Document Revised: 05/10/2018 Document Reviewed: 02/22/2016 Elsevier Patient Education  [place].

## 2019-05-16 NOTE — Progress Notes (Signed)
NEW OB HISTORY AND PHYSICAL  SUBJECTIVE:       Kathleen Barnes is a 19 y.o. G1P0 female, Patient's last menstrual period was 02/18/2019., Estimated Date of Delivery: 11/25/19, [redacted]w[redacted]d, presents today for establishment of Prenatal Care.  Endorses nausea without vomiting and occasional cramping.   Denies difficulty breathing or respiratory distress, chest pain, abdominal pain, vaginal bleeding, dysuria, and leg pain or swelling.   Desires midwifery care. Declines genetic screening.    Gynecologic History  Patient's last menstrual period was 02/18/2019.   Contraception: none  Last Pap: N/A  Obstetric History  OB History  Gravida Para Term Preterm AB Living  1            SAB TAB Ectopic Multiple Live Births               # Outcome Date GA Lbr Len/2nd Weight Sex Delivery Anes PTL Lv  1 Current             Past Medical History:  Diagnosis Date  . History of chlamydia infection 10/16/2017    Past Surgical History:  Procedure Laterality Date  . no surgical history      Current Outpatient Medications on File Prior to Visit  Medication Sig Dispense Refill  . Prenatal Vit-Fe Fumarate-FA (MULTIVITAMIN-PRENATAL) 27-0.8 MG TABS tablet Take 1 tablet by mouth daily at 12 noon.     No current facility-administered medications on file prior to visit.    No Known Allergies  Social History   Socioeconomic History  . Marital status: Single    Spouse name: Not on file  . Number of children: Not on file  . Years of education: Not on file  . Highest education level: Not on file  Occupational History  . Not on file  Tobacco Use  . Smoking status: Never Smoker  . Smokeless tobacco: Never Used  Substance and Sexual Activity  . Alcohol use: No  . Drug use: No  . Sexual activity: Yes  Other Topics Concern  . Not on file  Social History Narrative  . Not on file   Social Determinants of Health   Financial Resource Strain:   . Difficulty of Paying Living Expenses:   Food  Insecurity:   . Worried About Programme researcher, broadcasting/film/video in the Last Year:   . Barista in the Last Year:   Transportation Needs:   . Freight forwarder (Medical):   Marland Kitchen Lack of Transportation (Non-Medical):   Physical Activity:   . Days of Exercise per Week:   . Minutes of Exercise per Session:   Stress:   . Feeling of Stress :   Social Connections:   . Frequency of Communication with Friends and Family:   . Frequency of Social Gatherings with Friends and Family:   . Attends Religious Services:   . Active Member of Clubs or Organizations:   . Attends Banker Meetings:   Marland Kitchen Marital Status:   Intimate Partner Violence:   . Fear of Current or Ex-Partner:   . Emotionally Abused:   Marland Kitchen Physically Abused:   . Sexually Abused:     Family History  Problem Relation Age of Onset  . Hypertension Mother   . Healthy Father   . Heart attack Maternal Grandmother   . Migraines Maternal Grandmother     The following portions of the patient's history were reviewed and updated as appropriate: allergies, current medications, past OB history, past medical history, past surgical history, past  family history, past social history, and problem list.  Review of Systems:  ROS negative except as noted above. Information obtained from patient.   OBJECTIVE:  BP 112/65   Pulse (!) 103   Wt 146 lb 8 oz (66.5 kg)   LMP 02/18/2019   BMI 29.59 kg/m   Initial Physical Exam (New OB)  GENERAL APPEARANCE: alert, well appearing, in no apparent distress  HEAD: normocephalic, atraumatic  MOUTH: mucous membranes moist, pharynx normal without lesions  THYROID: no thyromegaly or masses present  BREASTS: no masses noted, no significant tenderness, no palpable axillary nodes, no skin changes  LUNGS: clear to auscultation, no wheezes, rales or rhonchi, symmetric air entry  HEART: regular rate and rhythm, no murmurs  ABDOMEN: soft, nontender, nondistended, no abnormal masses, no  epigastric pain and FHT present  EXTREMITIES: no redness or tenderness in the calves or thighs, no edema  SKIN: normal coloration and turgor, no rashes, professional and unprofessional tattoos present  LYMPH NODES: no adenopathy palpable  NEUROLOGIC: alert, oriented, normal speech, no focal findings or movement disorder noted  PELVIC EXAM: deferred, no complaints  ASSESSMENT: Normal/Teen pregnancy Rh positive Varicella non-immune Desires midwifery care Declines genetic screening  PLAN: Prenatal care New OB counseling: The patient has been given an overview regarding routine prenatal care. Recommendations regarding diet, weight gain, and exercise in pregnancy were given. Prenatal testing, optional genetic testing, and ultrasound use in pregnancy were reviewed.  Benefits of Breast Feeding were discussed. The patient is encouraged to consider nursing her baby post partum. See orders

## 2019-05-17 LAB — CBC
Hematocrit: 37.6 % (ref 34.0–46.6)
Hemoglobin: 12.8 g/dL (ref 11.1–15.9)
MCH: 28.2 pg (ref 26.6–33.0)
MCHC: 34 g/dL (ref 31.5–35.7)
MCV: 83 fL (ref 79–97)
Platelets: 226 10*3/uL (ref 150–450)
RBC: 4.54 x10E6/uL (ref 3.77–5.28)
RDW: 13.4 % (ref 11.7–15.4)
WBC: 4.1 10*3/uL (ref 3.4–10.8)

## 2019-05-17 LAB — HCV COMMENT:

## 2019-05-17 LAB — HEPATITIS C ANTIBODY (REFLEX): HCV Ab: <0.1 s/co ratio (ref 0.0–0.9)

## 2019-06-13 NOTE — Progress Notes (Signed)
ROB-Pt present for routine prenatal care 

## 2019-06-13 NOTE — Patient Instructions (Signed)
Second Trimester of Pregnancy  The second trimester is from week 14 through week 27 (month 4 through 6). This is often the time in pregnancy that you feel your best. Often times, morning sickness has lessened or quit. You may have more energy, and you may get hungry more often. Your unborn baby is growing rapidly. At the end of the sixth month, he or she is about 9 inches long and weighs about 1 pounds. You will likely feel the baby move between 18 and 20 weeks of pregnancy. Follow these instructions at home: Medicines  Take over-the-counter and prescription medicines only as told by your doctor. Some medicines are safe and some medicines are not safe during pregnancy.  Take a prenatal vitamin that contains at least 600 micrograms (mcg) of folic acid.  If you have trouble pooping (constipation), take medicine that will make your stool soft (stool softener) if your doctor approves. Eating and drinking   Eat regular, healthy meals.  Avoid raw meat and uncooked cheese.  If you get low calcium from the food you eat, talk to your doctor about taking a daily calcium supplement.  Avoid foods that are high in fat and sugars, such as fried and sweet foods.  If you feel sick to your stomach (nauseous) or throw up (vomit): ? Eat 4 or 5 small meals a day instead of 3 large meals. ? Try eating a few soda crackers. ? Drink liquids between meals instead of during meals.  To prevent constipation: ? Eat foods that are high in fiber, like fresh fruits and vegetables, whole grains, and beans. ? Drink enough fluids to keep your pee (urine) clear or pale yellow. Activity  Exercise only as told by your doctor. Stop exercising if you start to have cramps.  Do not exercise if it is too hot, too humid, or if you are in a place of great height (high altitude).  Avoid heavy lifting.  Wear low-heeled shoes. Sit and stand up straight.  You can continue to have sex unless your doctor tells you not  to. Relieving pain and discomfort  Wear a good support bra if your breasts are tender.  Take warm water baths (sitz baths) to soothe pain or discomfort caused by hemorrhoids. Use hemorrhoid cream if your doctor approves.  Rest with your legs raised if you have leg cramps or low back pain.  If you develop puffy, bulging veins (varicose veins) in your legs: ? Wear support hose or compression stockings as told by your doctor. ? Raise (elevate) your feet for 15 minutes, 3-4 times a day. ? Limit salt in your food. Prenatal care  Write down your questions. Take them to your prenatal visits.  Keep all your prenatal visits as told by your doctor. This is important. Safety  Wear your seat belt when driving.  Make a list of emergency phone numbers, including numbers for family, friends, the hospital, and police and fire departments. General instructions  Ask your doctor about the right foods to eat or for help finding a counselor, if you need these services.  Ask your doctor about local prenatal classes. Begin classes before month 6 of your pregnancy.  Do not use hot tubs, steam rooms, or saunas.  Do not douche or use tampons or scented sanitary pads.  Do not cross your legs for long periods of time.  Visit your dentist if you have not done so. Use a soft toothbrush to brush your teeth. Floss gently.  Avoid all smoking, herbs,   and alcohol. Avoid drugs that are not approved by your doctor.  Do not use any products that contain nicotine or tobacco, such as cigarettes and e-cigarettes. If you need help quitting, ask your doctor.  Avoid cat litter boxes and soil used by cats. These carry germs that can cause birth defects in the baby and can cause a loss of your baby (miscarriage) or stillbirth. Contact a doctor if:  You have mild cramps or pressure in your lower belly.  You have pain when you pee (urinate).  You have bad smelling fluid coming from your vagina.  You continue to  feel sick to your stomach (nauseous), throw up (vomit), or have watery poop (diarrhea).  You have a nagging pain in your belly area.  You feel dizzy. Get help right away if:  You have a fever.  You are leaking fluid from your vagina.  You have spotting or bleeding from your vagina.  You have severe belly cramping or pain.  You lose or gain weight rapidly.  You have trouble catching your breath and have chest pain.  You notice sudden or extreme puffiness (swelling) of your face, hands, ankles, feet, or legs.  You have not felt the baby move in over an hour.  You have severe headaches that do not go away when you take medicine.  You have trouble seeing. Summary  The second trimester is from week 14 through week 27 (months 4 through 6). This is often the time in pregnancy that you feel your best.  To take care of yourself and your unborn baby, you will need to eat healthy meals, take medicines only if your doctor tells you to do so, and do activities that are safe for you and your baby.  Call your doctor if you get sick or if you notice anything unusual about your pregnancy. Also, call your doctor if you need help with the right food to eat, or if you want to know what activities are safe for you. This information is not intended to replace advice given to you by your health care provider. Make sure you discuss any questions you have with your health care provider. Document Revised: 05/10/2018 Document Reviewed: 02/22/2016 Elsevier Patient Education  [place]. Common Medications Safe in Pregnancy  Acne:      Constipation:  Benzoyl Peroxide     Colace  Clindamycin      Dulcolax Suppository  Topica Erythromycin     Fibercon  Salicylic Acid      Metamucil         Miralax AVOID:        Senakot   Accutane    Cough:  Retin-A       Cough Drops  Tetracycline      Phenergan w/ Codeine if Rx  Minocycline      Robitussin (Plain &  DM)  Antibiotics:     Crabs/Lice:  Ceclor       RID  Cephalosporins    AVOID:  E-Mycins      Kwell  Keflex  Macrobid/Macrodantin   Diarrhea:  Penicillin      Kao-Pectate  Zithromax      Imodium AD         PUSH FLUIDS AVOID:       Cipro     Fever:  Tetracycline      Tylenol (Regular or Extra  Minocycline       Strength)  Levaquin      Extra Strength-Do not  Exceed 8 tabs/24 hrs Caffeine:        <235m/day (equiv. To 1 cup of coffee or  approx. 3 12 oz sodas)         Gas: Cold/Hayfever:       Gas-X  Benadryl      Mylicon  Claritin       Phazyme  **Claritin-D        Chlor-Trimeton    Headaches:  Dimetapp      ASA-Free Excedrin  Drixoral-Non-Drowsy     Cold Compress  Mucinex (Guaifenasin)     Tylenol (Regular or Extra  Sudafed/Sudafed-12 Hour     Strength)  **Sudafed PE Pseudoephedrine   Tylenol Cold & Sinus     Vicks Vapor Rub  Zyrtec  **AVOID if Problems With Blood Pressure         Heartburn: Avoid lying down for at least 1 hour after meals  Aciphex      Maalox     Rash:  Milk of Magnesia     Benadryl    Mylanta       1% Hydrocortisone Cream  Pepcid  Pepcid Complete   Sleep Aids:  Prevacid      Ambien   Prilosec       Benadryl  Rolaids       Chamomile Tea  Tums (Limit 4/day)     Unisom  Zantac       Tylenol PM         Warm milk-add vanilla or  Hemorrhoids:       Sugar for taste  Anusol/Anusol H.C.  (RX: Analapram 2.5%)  Sugar Substitutes:  Hydrocortisone OTC     Ok in moderation  Preparation H      Tucks        Vaseline lotion applied to tissue with wiping    Herpes:     Throat:  Acyclovir      Oragel  Famvir  Valtrex     Vaccines:         Flu Shot Leg Cramps:       *Gardasil  Benadryl      Hepatitis A         Hepatitis B Nasal Spray:       Pneumovax  Saline Nasal Spray     Polio Booster         Tetanus Nausea:       Tuberculosis test or PPD  Vitamin B6 25 mg TID   AVOID:    Dramamine      *Gardasil  Emetrol       Live  Poliovirus  Ginger Root 250 mg QID    MMR (measles, mumps &  High Complex Carbs @ Bedtime    rebella)  Sea Bands-Accupressure    Varicella (Chickenpox)  Unisom 1/2 tab TID     *No known complications           If received before Pain:         Known pregnancy;   Darvocet       Resume series after  Lortab        Delivery  Percocet    Yeast:   Tramadol      Femstat  Tylenol 3      Gyne-lotrimin  Ultram       Monistat  Vicodin           MISC:         All Sunscreens  Hair Coloring/highlights          Insect Repellant's          (Including DEET)         Mystic Tans Breastfeeding  Choosing to breastfeed is one of the best decisions you can make for yourself and your baby. A change in hormones during pregnancy causes your breasts to make breast milk in your milk-producing glands. Hormones prevent breast milk from being released before your baby is born. They also prompt milk flow after birth. Once breastfeeding has begun, thoughts of your baby, as well as his or her sucking or crying, can stimulate the release of milk from your milk-producing glands. Benefits of breastfeeding Research shows that breastfeeding offers many health benefits for infants and mothers. It also offers a cost-free and convenient way to feed your baby. For your baby  Your first milk (colostrum) helps your baby's digestive system to function better.  Special cells in your milk (antibodies) help your baby to fight off infections.  Breastfed babies are less likely to develop asthma, allergies, obesity, or type 2 diabetes. They are also at lower risk for sudden infant death syndrome (SIDS).  Nutrients in breast milk are better able to meet your baby's needs compared to infant formula.  Breast milk improves your baby's brain development. For you  Breastfeeding helps to create a very special bond between you and your baby.  Breastfeeding is convenient. Breast milk costs nothing and is always available at the  correct temperature.  Breastfeeding helps to burn calories. It helps you to lose the weight that you gained during pregnancy.  Breastfeeding makes your uterus return faster to its size before pregnancy. It also slows bleeding (lochia) after you give birth.  Breastfeeding helps to lower your risk of developing type 2 diabetes, osteoporosis, rheumatoid arthritis, cardiovascular disease, and breast, ovarian, uterine, and endometrial cancer later in life. Breastfeeding basics Starting breastfeeding  Find a comfortable place to sit or lie down, with your neck and back well-supported.  Place a pillow or a rolled-up blanket under your baby to bring him or her to the level of your breast (if you are seated). Nursing pillows are specially designed to help support your arms and your baby while you breastfeed.  Make sure that your baby's tummy (abdomen) is facing your abdomen.  Gently massage your breast. With your fingertips, massage from the outer edges of your breast inward toward the nipple. This encourages milk flow. If your milk flows slowly, you may need to continue this action during the feeding.  Support your breast with 4 fingers underneath and your thumb above your nipple (make the letter "C" with your hand). Make sure your fingers are well away from your nipple and your baby's mouth.  Stroke your baby's lips gently with your finger or nipple.  When your baby's mouth is open wide enough, quickly bring your baby to your breast, placing your entire nipple and as much of the areola as possible into your baby's mouth. The areola is the colored area around your nipple. ? More areola should be visible above your baby's upper lip than below the lower lip. ? Your baby's lips should be opened and extended outward (flanged) to ensure an adequate, comfortable latch. ? Your baby's tongue should be between his or her lower gum and your breast.  Make sure that your baby's mouth is correctly positioned  around your nipple (latched). Your baby's lips should create a seal on your breast and be turned   out (everted).  It is common for your baby to suck about 2-3 minutes in order to start the flow of breast milk. Latching Teaching your baby how to latch onto your breast properly is very important. An improper latch can cause nipple pain, decreased milk supply, and poor weight gain in your baby. Also, if your baby is not latched onto your nipple properly, he or she may swallow some air during feeding. This can make your baby fussy. Burping your baby when you switch breasts during the feeding can help to get rid of the air. However, teaching your baby to latch on properly is still the best way to prevent fussiness from swallowing air while breastfeeding. Signs that your baby has successfully latched onto your nipple  Silent tugging or silent sucking, without causing you pain. Infant's lips should be extended outward (flanged).  Swallowing heard between every 3-4 sucks once your milk has started to flow (after your let-down milk reflex occurs).  Muscle movement above and in front of his or her ears while sucking. Signs that your baby has not successfully latched onto your nipple  Sucking sounds or smacking sounds from your baby while breastfeeding.  Nipple pain. If you think your baby has not latched on correctly, slip your finger into the corner of your baby's mouth to break the suction and place it between your baby's gums. Attempt to start breastfeeding again. Signs of successful breastfeeding Signs from your baby  Your baby will gradually decrease the number of sucks or will completely stop sucking.  Your baby will fall asleep.  Your baby's body will relax.  Your baby will retain a small amount of milk in his or her mouth.  Your baby will let go of your breast by himself or herself. Signs from you  Breasts that have increased in firmness, weight, and size 1-3 hours after  feeding.  Breasts that are softer immediately after breastfeeding.  Increased milk volume, as well as a change in milk consistency and color by the fifth day of breastfeeding.  Nipples that are not sore, cracked, or bleeding. Signs that your baby is getting enough milk  Wetting at least 1-2 diapers during the first 24 hours after birth.  Wetting at least 5-6 diapers every 24 hours for the first week after birth. The urine should be clear or pale yellow by the age of 5 days.  Wetting 6-8 diapers every 24 hours as your baby continues to grow and develop.  At least 3 stools in a 24-hour period by the age of 5 days. The stool should be soft and yellow.  At least 3 stools in a 24-hour period by the age of 7 days. The stool should be seedy and yellow.  No loss of weight greater than 10% of birth weight during the first 3 days of life.  Average weight gain of 4-7 oz (113-198 g) per week after the age of 4 days.  Consistent daily weight gain by the age of 5 days, without weight loss after the age of 2 weeks. After a feeding, your baby may spit up a small amount of milk. This is normal. Breastfeeding frequency and duration Frequent feeding will help you make more milk and can prevent sore nipples and extremely full breasts (breast engorgement). Breastfeed when you feel the need to reduce the fullness of your breasts or when your baby shows signs of hunger. This is called "breastfeeding on demand." Signs that your baby is hungry include:  Increased alertness, activity,  or restlessness.  Movement of the head from side to side.  Opening of the mouth when the corner of the mouth or cheek is stroked (rooting).  Increased sucking sounds, smacking lips, cooing, sighing, or squeaking.  Hand-to-mouth movements and sucking on fingers or hands.  Fussing or crying. Avoid introducing a pacifier to your baby in the first 4-6 weeks after your baby is born. After this time, you may choose to use a  pacifier. Research has shown that pacifier use during the first year of a baby's life decreases the risk of sudden infant death syndrome (SIDS). Allow your baby to feed on each breast as long as he or she wants. When your baby unlatches or falls asleep while feeding from the first breast, offer the second breast. Because newborns are often sleepy in the first few weeks of life, you may need to awaken your baby to get him or her to feed. Breastfeeding times will vary from baby to baby. However, the following rules can serve as a guide to help you make sure that your baby is properly fed:  Newborns (babies 40 weeks of age or younger) may breastfeed every 1-3 hours.  Newborns should not go without breastfeeding for longer than 3 hours during the day or 5 hours during the night.  You should breastfeed your baby a minimum of 8 times in a 24-hour period. Breast milk pumping     Pumping and storing breast milk allows you to make sure that your baby is exclusively fed your breast milk, even at times when you are unable to breastfeed. This is especially important if you go back to work while you are still breastfeeding, or if you are not able to be present during feedings. Your lactation consultant can help you find a method of pumping that works best for you and give you guidelines about how long it is safe to store breast milk. Caring for your breasts while you breastfeed Nipples can become dry, cracked, and sore while breastfeeding. The following recommendations can help keep your breasts moisturized and healthy:  Avoid using soap on your nipples.  Wear a supportive bra designed especially for nursing. Avoid wearing underwire-style bras or extremely tight bras (sports bras).  Air-dry your nipples for 3-4 minutes after each feeding.  Use only cotton bra pads to absorb leaked breast milk. Leaking of breast milk between feedings is normal.  Use lanolin on your nipples after breastfeeding. Lanolin  helps to maintain your skin's normal moisture barrier. Pure lanolin is not harmful (not toxic) to your baby. You may also hand express a few drops of breast milk and gently massage that milk into your nipples and allow the milk to air-dry. In the first few weeks after giving birth, some women experience breast engorgement. Engorgement can make your breasts feel heavy, warm, and tender to the touch. Engorgement peaks within 3-5 days after you give birth. The following recommendations can help to ease engorgement:  Completely empty your breasts while breastfeeding or pumping. You may want to start by applying warm, moist heat (in the shower or with warm, water-soaked hand towels) just before feeding or pumping. This increases circulation and helps the milk flow. If your baby does not completely empty your breasts while breastfeeding, pump any extra milk after he or she is finished.  Apply ice packs to your breasts immediately after breastfeeding or pumping, unless this is too uncomfortable for you. To do this: ? Put ice in a plastic bag. ? Place a  towel between your skin and the bag. ? Leave the ice on for 20 minutes, 2-3 times a day.  Make sure that your baby is latched on and positioned properly while breastfeeding. If engorgement persists after 48 hours of following these recommendations, contact your health care provider or a Science writer. Overall health care recommendations while breastfeeding  Eat 3 healthy meals and 3 snacks every day. Well-nourished mothers who are breastfeeding need an additional 450-500 calories a day. You can meet this requirement by increasing the amount of a balanced diet that you eat.  Drink enough water to keep your urine pale yellow or clear.  Rest often, relax, and continue to take your prenatal vitamins to prevent fatigue, stress, and low vitamin and mineral levels in your body (nutrient deficiencies).  Do not use any products that contain nicotine or  tobacco, such as cigarettes and e-cigarettes. Your baby may be harmed by chemicals from cigarettes that pass into breast milk and exposure to secondhand smoke. If you need help quitting, ask your health care provider.  Avoid alcohol.  Do not use illegal drugs or marijuana.  Talk with your health care provider before taking any medicines. These include over-the-counter and prescription medicines as well as vitamins and herbal supplements. Some medicines that may be harmful to your baby can pass through breast milk.  It is possible to become pregnant while breastfeeding. If birth control is desired, ask your health care provider about options that will be safe while breastfeeding your baby. Where to find more information: [employer]: www.llli.org Contact a health care provider if:  You feel like you want to stop breastfeeding or have become frustrated with breastfeeding.  Your nipples are cracked or bleeding.  Your breasts are red, tender, or warm.  You have: ? Painful breasts or nipples. ? A swollen area on either breast. ? A fever or chills. ? Nausea or vomiting. ? Drainage other than breast milk from your nipples.  Your breasts do not become full before feedings by the fifth day after you give birth.  You feel sad and depressed.  Your baby is: ? Too sleepy to eat well. ? Having trouble sleeping. ? More than 19 week old and wetting fewer than 6 diapers in a 24-hour period. ? Not gaining weight by 61 days of age.  Your baby has fewer than 3 stools in a 24-hour period.  Your baby's skin or the white parts of his or her eyes become yellow. Get help right away if:  Your baby is overly tired (lethargic) and does not want to wake up and feed.  Your baby develops an unexplained fever. Summary  Breastfeeding offers many health benefits for infant and mothers.  Try to breastfeed your infant when he or she shows early signs of hunger.  Gently tickle or stroke  your baby's lips with your finger or nipple to allow the baby to open his or her mouth. Bring the baby to your breast. Make sure that much of the areola is in your baby's mouth. Offer one side and burp the baby before you offer the other side.  Talk with your health care provider or lactation consultant if you have questions or you face problems as you breastfeed. This information is not intended to replace advice given to you by your health care provider. Make sure you discuss any questions you have with your health care provider. Document Revised: 04/12/2017 Document Reviewed: 02/18/2016 Elsevier Patient Education  [place].

## 2019-06-16 ENCOUNTER — Ambulatory Visit (INDEPENDENT_AMBULATORY_CARE_PROVIDER_SITE_OTHER): Payer: No Typology Code available for payment source | Admitting: Certified Nurse Midwife

## 2019-06-16 ENCOUNTER — Other Ambulatory Visit: Payer: Self-pay

## 2019-06-16 ENCOUNTER — Encounter: Payer: Self-pay | Admitting: Certified Nurse Midwife

## 2019-06-16 VITALS — BP 109/72 | HR 96 | Wt 151.4 lb

## 2019-06-16 DIAGNOSIS — Z6830 Body mass index (BMI) 30.0-30.9, adult: Secondary | ICD-10-CM

## 2019-06-16 DIAGNOSIS — Z3402 Encounter for supervision of normal first pregnancy, second trimester: Secondary | ICD-10-CM

## 2019-06-16 DIAGNOSIS — Z3A16 16 weeks gestation of pregnancy: Secondary | ICD-10-CM

## 2019-06-16 LAB — POCT URINALYSIS DIPSTICK OB
Bilirubin, UA: NEGATIVE
Blood, UA: NEGATIVE
Glucose, UA: NEGATIVE
Ketones, UA: NEGATIVE
Leukocytes, UA: NEGATIVE
Nitrite, UA: NEGATIVE
POC,PROTEIN,UA: NEGATIVE
Spec Grav, UA: 1.025 (ref 1.010–1.025)
Urobilinogen, UA: 0.2 E.U./dL
pH, UA: 5 (ref 5.0–8.0)

## 2019-06-16 NOTE — Progress Notes (Signed)
ROB doing well. Feel good movement. Discussed anatomy scan next visit. Body mass index is 30.59 kg/m. Hem A1c today and genetic screening test. Pt has decided to have it done. Will follow up with results. States that the nausea is resolving. Follow up 4 wks with Marcelino Duster.   Doreene Burke, CNM

## 2019-06-17 LAB — HEMOGLOBIN A1C
Est. average glucose Bld gHb Est-mCnc: 114 mg/dL
Hgb A1c MFr Bld: 5.6 % (ref 4.8–5.6)

## 2019-07-17 ENCOUNTER — Ambulatory Visit (INDEPENDENT_AMBULATORY_CARE_PROVIDER_SITE_OTHER): Payer: No Typology Code available for payment source | Admitting: Certified Nurse Midwife

## 2019-07-17 ENCOUNTER — Ambulatory Visit (INDEPENDENT_AMBULATORY_CARE_PROVIDER_SITE_OTHER): Payer: No Typology Code available for payment source

## 2019-07-17 ENCOUNTER — Other Ambulatory Visit: Payer: Self-pay

## 2019-07-17 VITALS — BP 104/70 | HR 92 | Wt 159.2 lb

## 2019-07-17 DIAGNOSIS — Z3A21 21 weeks gestation of pregnancy: Secondary | ICD-10-CM

## 2019-07-17 DIAGNOSIS — Z3402 Encounter for supervision of normal first pregnancy, second trimester: Secondary | ICD-10-CM | POA: Diagnosis not present

## 2019-07-17 DIAGNOSIS — O283 Abnormal ultrasonic finding on antenatal screening of mother: Secondary | ICD-10-CM

## 2019-07-17 DIAGNOSIS — O43199 Other malformation of placenta, unspecified trimester: Secondary | ICD-10-CM | POA: Insufficient documentation

## 2019-07-17 HISTORY — DX: Abnormal ultrasonic finding on antenatal screening of mother: O28.3

## 2019-07-17 HISTORY — DX: Other malformation of placenta, unspecified trimester: O43.199

## 2019-07-17 LAB — POCT URINALYSIS DIPSTICK OB
Bilirubin, UA: NEGATIVE
Blood, UA: NEGATIVE
Glucose, UA: NEGATIVE
Ketones, UA: NEGATIVE
Leukocytes, UA: NEGATIVE
Nitrite, UA: NEGATIVE
POC,PROTEIN,UA: NEGATIVE
Spec Grav, UA: 1.02 (ref 1.010–1.025)
Urobilinogen, UA: 0.2 E.U./dL
pH, UA: 5 (ref 5.0–8.0)

## 2019-07-17 NOTE — Patient Instructions (Addendum)

## 2019-07-17 NOTE — Progress Notes (Signed)
ROB-Doing well. Has no questions or concerns today. Reviewed anatomy scan with patient.Discussed Echogenic Foci and handout provided and marginal cord insertion. Reviewed Panorama screening with patient  Discussed follow up growth ultrasound at 28 weeks. All questions answered. Anticipatory guidance give. Reviewed red flags and when to call the office.  RTC x 4 weeks for ROB then RTC x 8 weeks for ROB/Growth ultrasound / 28 week labs or sooner if needed.  Glorious Peach RN [facility] 07/17/19 9:59 AM

## 2019-07-17 NOTE — Progress Notes (Signed)
ULTRASOUND REPORT  Location: [facility] Date of Service: 07/17/2019   Indications:Anatomy Ultrasound Findings:  Singleton intrauterine pregnancy is visualized with FHR at 160 BPM. Biometrics give an (U/S) Gestational age of [redacted]w[redacted]d and an (U/S) EDD of 12/03/2019; this correlates with the clinically established Estimated Date of Delivery: 11/25/19  Fetal presentation is Variable.  EFW: 338 g ( 12 oz).  Placenta: posterior. Grade: 1 , Maginal cord insertion AFI: subjectively normal.  Anatomic survey is complete and normal; Gender - female.    Two echogenic foci with-in the fetal heart measuring 2 mm each.  Right Ovary is normal in appearance. Left Ovary is normal appearance. Survey of the adnexa demonstrates no adnexal masses. There is no free peritoneal fluid in the cul de sac.  Impression: 1. [redacted]w[redacted]d Viable Singleton Intrauterine pregnancy by U/S. 2. (U/S) EDD is consistent with Clinically established Estimated Date of Delivery: 11/25/19 . 3. Two echogenic foci with-in the fetal heart measuring 2 mm each. 4. Marginal cord insertion.  Recommendations: 1.Clinical correlation with the patient's History and Physical Exam.    I have seen, interviewed, and examined the patient in conjunction with the [employer] Nurse Practitioner student and affirm the diagnosis and management plan.   Gunnar Bulla, CNM [facility], [facility] 07/17/19 12:43 PM

## 2019-07-31 ENCOUNTER — Telehealth: Payer: Self-pay

## 2019-07-31 ENCOUNTER — Telehealth: Payer: Self-pay | Admitting: Certified Nurse Midwife

## 2019-07-31 NOTE — Telephone Encounter (Signed)
mychart message sent to patient

## 2019-07-31 NOTE — Telephone Encounter (Signed)
This patient called in stating she is having some congestion and thinks she may have a common cold. Given that the patient is pregnant she would like a call from provider on what she can or cant take/do. Could you please advise?

## 2019-08-08 IMAGING — CT CT RENAL STONE PROTOCOL
3 of 4 series · 8 of 46 positions shown, 15 images · non-contrast
Comparison: None.

CLINICAL DATA: Right flank pain for 1 week

EXAM:
CT ABDOMEN AND PELVIS WITHOUT CONTRAST
TECHNIQUE: Multidetector CT imaging of the abdomen and pelvis was performed
following the standard protocol without IV contrast.

[Series 4: lung bases · axial · 0.64mm/px · z∈[-57,+3]mm · 4 of 20 slices shown, 9 images]
[im 4/20  soft-tissue]
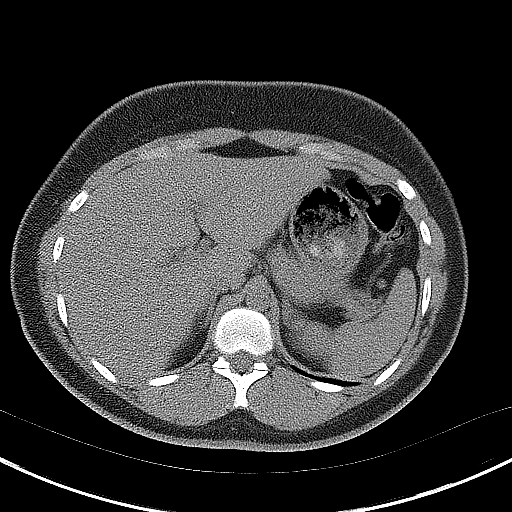
[im 4/20  lung]
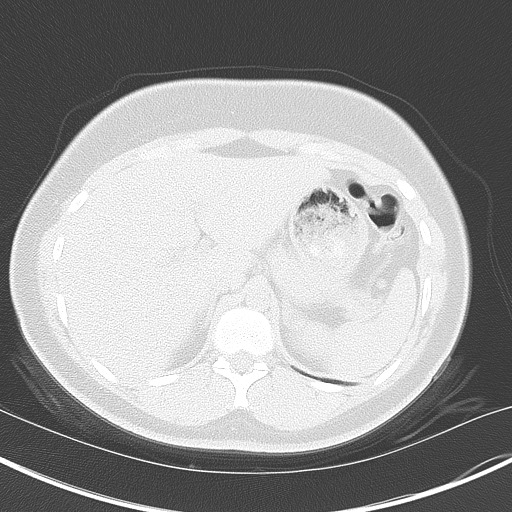
[im 4/20  bone]
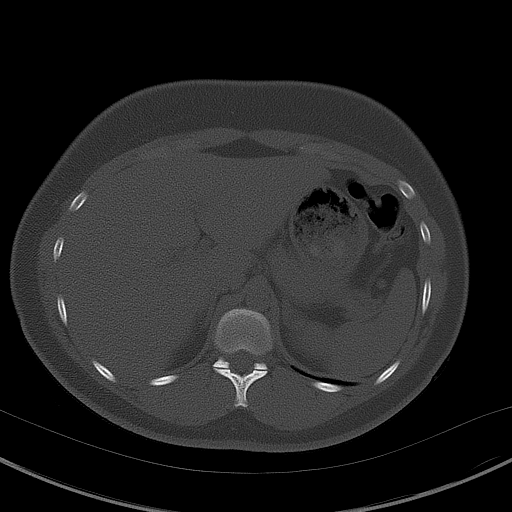
[im 8/20  soft-tissue]
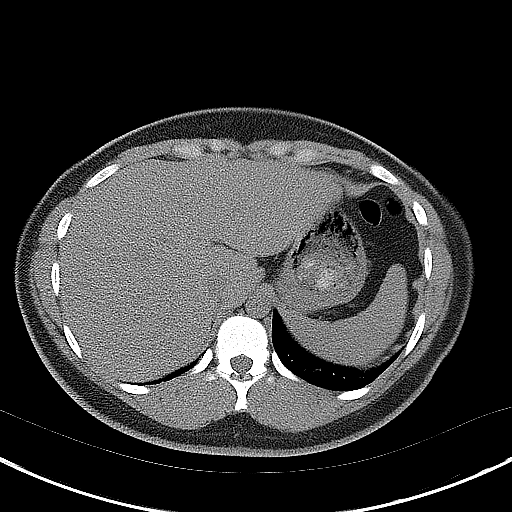
[im 8/20  lung]
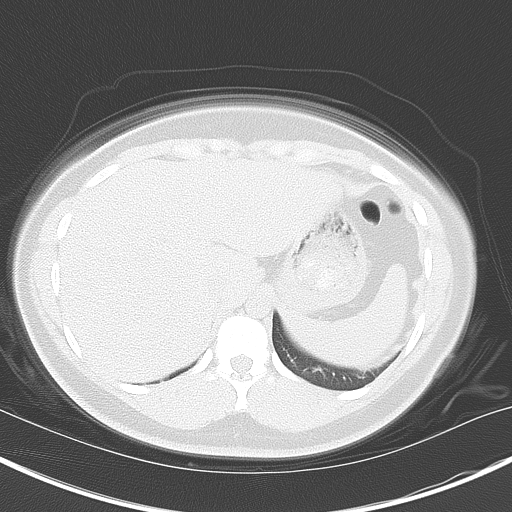
[im 12/20  soft-tissue]
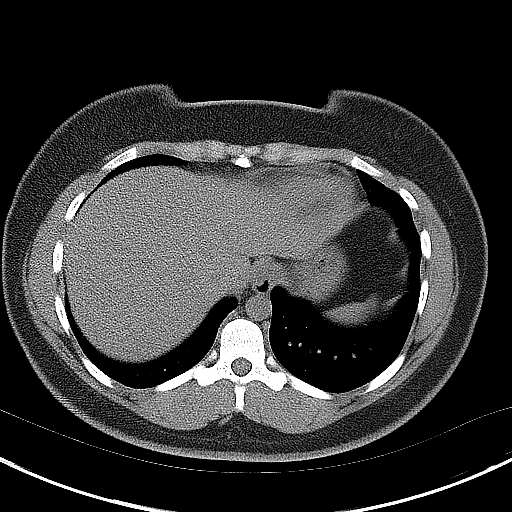
[im 12/20  lung]
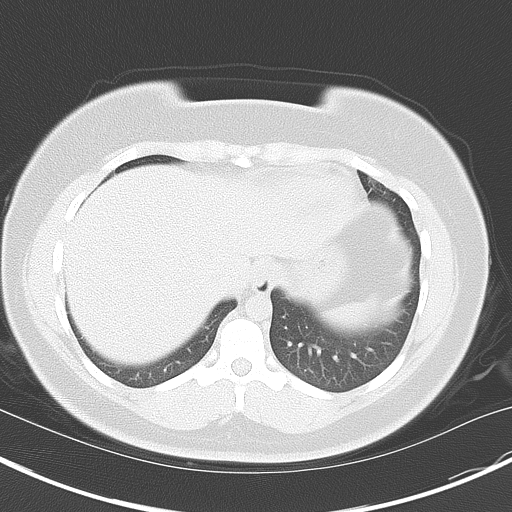
[im 16/20  soft-tissue]
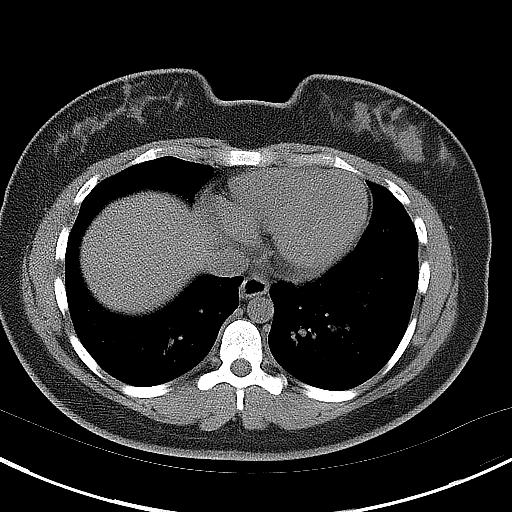
[im 16/20  lung]
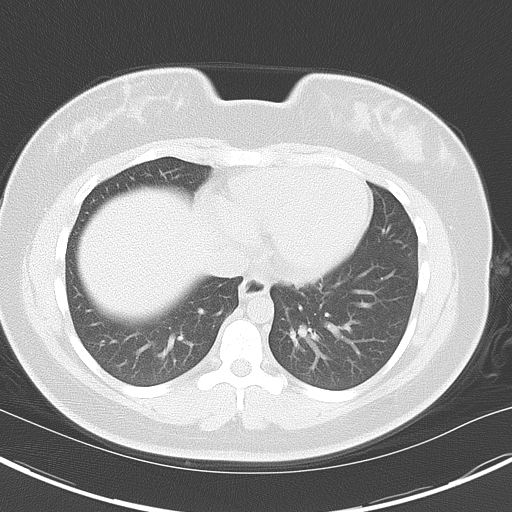

[Series 5: coronal · coronal · 0.69mm/px · 3 of 122 slices shown, 4 images]
[im 41/122  soft-tissue]
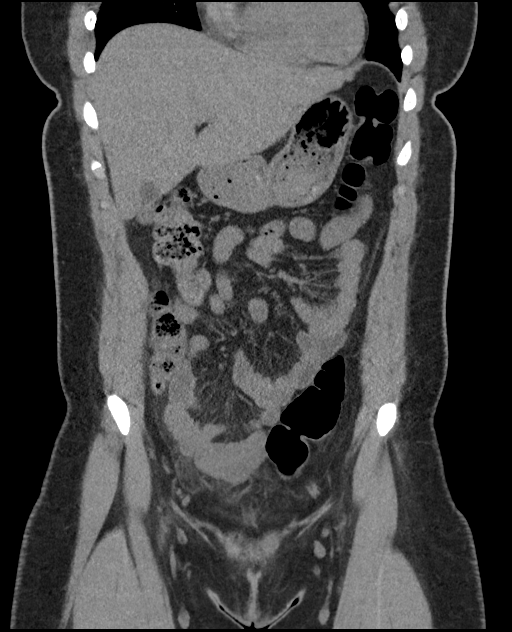
[im 54/122  soft-tissue]
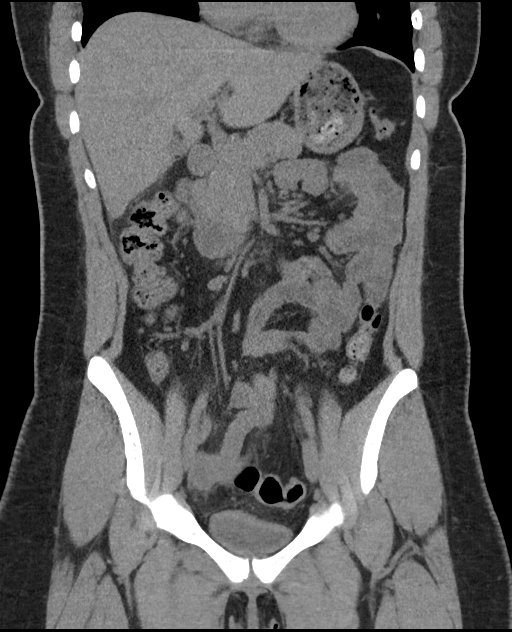
[im 54/122  bone]
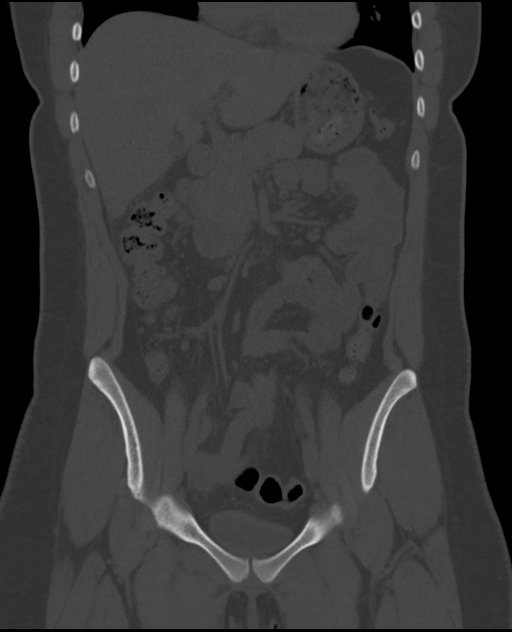
[im 68/122  soft-tissue]
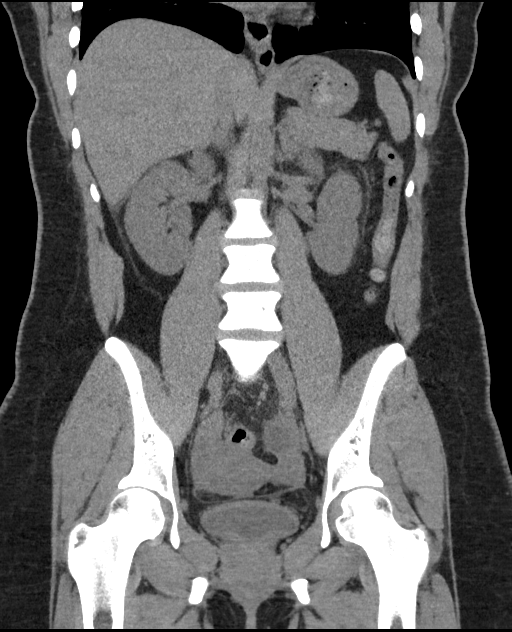

[Series 6: sagittal · sagittal · 0.52mm/px · 1 of 159 slices shown, 2 images]
[im 53/159  soft-tissue]
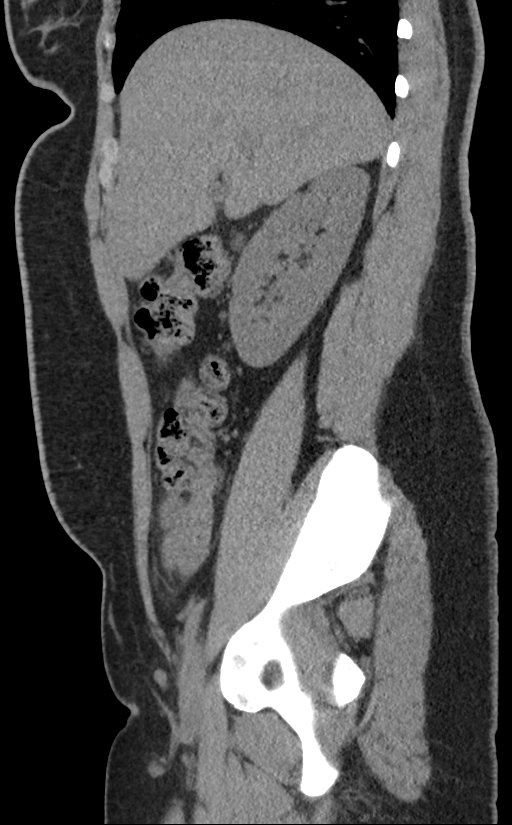
[im 53/159  bone]
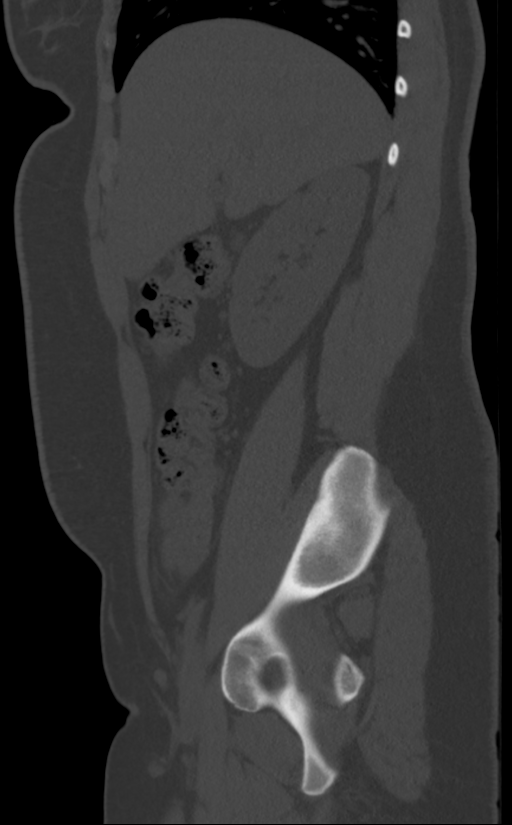

[8 of 46 positions shown; findings below may reference images not displayed]

FINDINGS: Lower chest: No acute abnormality.

Hepatobiliary: No focal hepatic abnormality. Negative for biliary
dilatation. Possible small stones in the gallbladder fundus. Mild
hazy edema and small fluid in the right upper quadrant adjacent to
the tip of the liver.

Pancreas: Unremarkable. No pancreatic ductal dilatation or
surrounding inflammatory changes.

Spleen: Normal in size without focal abnormality.

Adrenals/Urinary Tract: Adrenal glands are within normal limits. No
hydronephrosis. Punctate calcification in the mid to lower pole of
right kidney slightly thick-walled appearance of the bladder.

Stomach/Bowel: The stomach is nonenlarged. No dilated small bowel.
Small stones in the appendix with borderline enlargement measuring
up to 7 mm but no definite periappendiceal soft tissue stranding.

Vascular/Lymphatic: No significant vascular findings are present. No
enlarged abdominal or pelvic lymph nodes.

Reproductive: Uterus and bilateral adnexa are unremarkable.

Other: Negative for free air. Generalized edema and soft tissue
stranding within the pelvic mesentery.

Musculoskeletal: No acute or significant osseous findings.
IMPRESSION: 1. Negative for hydronephrosis or ureteral stone. Punctate
calcification in the mid to lower right kidney
2. Slightly thick-walled appearance of the bladder, could be due to
under distension versus cystitis.
3. Edema and mild inflammation within the pelvic mesenteric fat,
this is slightly superior to the bladder. It is unclear if this is
due to cystitis, generalized pelvic inflammatory process/PID, or
inflammation from adjacent small bowel (doubtful given absence of
significant wall thickening )
4. Possible small stones in the gallbladder fundus. Gallbladder wall
does not appear significantly thickened. There is however trace
edema and fluid in the right upper quadrant adjacent to the tip of
the right hepatic lobe. If symptomatology suggests gallbladder
disease, could further evaluate with ultrasound
5. Borderline enlarged appendix, measuring up to 7 mm but no
convincing evidence for appendicitis.

## 2019-08-13 ENCOUNTER — Other Ambulatory Visit: Payer: Self-pay

## 2019-08-13 ENCOUNTER — Ambulatory Visit (INDEPENDENT_AMBULATORY_CARE_PROVIDER_SITE_OTHER): Payer: No Typology Code available for payment source | Admitting: Certified Nurse Midwife

## 2019-08-13 ENCOUNTER — Encounter: Payer: Self-pay | Admitting: Certified Nurse Midwife

## 2019-08-13 VITALS — BP 103/63 | HR 123 | Wt 164.1 lb

## 2019-08-13 DIAGNOSIS — Z3A25 25 weeks gestation of pregnancy: Secondary | ICD-10-CM

## 2019-08-13 LAB — POCT URINALYSIS DIPSTICK OB
Bilirubin, UA: NEGATIVE
Blood, UA: NEGATIVE
Glucose, UA: NEGATIVE
Ketones, UA: NEGATIVE
Leukocytes, UA: NEGATIVE
Nitrite, UA: NEGATIVE
POC,PROTEIN,UA: NEGATIVE
Spec Grav, UA: 1.025 (ref 1.010–1.025)
Urobilinogen, UA: 0.2 E.U./dL
pH, UA: 5 (ref 5.0–8.0)

## 2019-08-13 NOTE — Progress Notes (Signed)
Rob doing well. Feels good movement. Discussed next appointment glucose screen, u/s for EIF & growth(marginal cord insertion) . Pt state she is interested in waterbirth. Position statements given with information on classes, tub rental , dula services , quiz , and consent form. She will review and bring back consent form. She will follow up 3 wk with Marcelino Duster.   Doreene Burke, CNM

## 2019-09-11 ENCOUNTER — Ambulatory Visit (INDEPENDENT_AMBULATORY_CARE_PROVIDER_SITE_OTHER): Payer: No Typology Code available for payment source

## 2019-09-11 ENCOUNTER — Other Ambulatory Visit: Payer: No Typology Code available for payment source

## 2019-09-11 ENCOUNTER — Ambulatory Visit (INDEPENDENT_AMBULATORY_CARE_PROVIDER_SITE_OTHER): Payer: No Typology Code available for payment source | Admitting: Certified Nurse Midwife

## 2019-09-11 ENCOUNTER — Other Ambulatory Visit: Payer: Self-pay

## 2019-09-11 ENCOUNTER — Encounter: Payer: Self-pay | Admitting: Certified Nurse Midwife

## 2019-09-11 VITALS — BP 104/66 | HR 94 | Wt 169.1 lb

## 2019-09-11 DIAGNOSIS — Z3A29 29 weeks gestation of pregnancy: Secondary | ICD-10-CM

## 2019-09-11 DIAGNOSIS — O43199 Other malformation of placenta, unspecified trimester: Secondary | ICD-10-CM

## 2019-09-11 DIAGNOSIS — Z23 Encounter for immunization: Secondary | ICD-10-CM

## 2019-09-11 DIAGNOSIS — Z3A25 25 weeks gestation of pregnancy: Secondary | ICD-10-CM

## 2019-09-11 DIAGNOSIS — Z3402 Encounter for supervision of normal first pregnancy, second trimester: Secondary | ICD-10-CM

## 2019-09-11 DIAGNOSIS — O283 Abnormal ultrasonic finding on antenatal screening of mother: Secondary | ICD-10-CM

## 2019-09-11 DIAGNOSIS — Z131 Encounter for screening for diabetes mellitus: Secondary | ICD-10-CM

## 2019-09-11 DIAGNOSIS — Z3403 Encounter for supervision of normal first pregnancy, third trimester: Secondary | ICD-10-CM

## 2019-09-11 DIAGNOSIS — Z130 Encounter for screening for diseases of the blood and blood-forming organs and certain disorders involving the immune mechanism: Secondary | ICD-10-CM

## 2019-09-11 DIAGNOSIS — Z3A21 21 weeks gestation of pregnancy: Secondary | ICD-10-CM

## 2019-09-11 LAB — POCT URINALYSIS DIPSTICK OB
Bilirubin, UA: NEGATIVE
Blood, UA: NEGATIVE
Glucose, UA: NEGATIVE
Leukocytes, UA: NEGATIVE
Nitrite, UA: NEGATIVE
Spec Grav, UA: 1.025 (ref 1.010–1.025)
Urobilinogen, UA: 0.2 E.U./dL
pH, UA: 6 (ref 5.0–8.0)

## 2019-09-11 NOTE — Patient Instructions (Signed)
Healthy Weight Gain During Pregnancy, Adult A certain amount of weight gain during pregnancy is normal and healthy. How much weight you should gain depends on your overall health and a measurement called BMI (body mass index). BMI is an estimate of your body fat based on your height and weight. You can use an online calculator to figure out your BMI, or you can ask your health care provider to calculate it for you at your next visit. Your recommended pregnancy weight gain is based on your pre-pregnancy BMI. General guidelines for a healthy total weight gain during pregnancy are listed below. If your BMI at or before the start of your pregnancy is:  Less than 18.5 (underweight), you should gain 28-40 lb (13-18 kg).  18.5-24.9 (normal weight), you should gain 25-35 lb (11-16 kg).  25-29.9 (overweight), you should gain 15-25 lb (7-11 kg).  30 or higher (obese), you should gain 11-20 lb (5-9 kg). These ranges vary depending on your individual health. If you are carrying more than one baby (multiples), it may be safe to gain more weight than these recommendations. If you gain less weight than recommended, that may be safe as long as your baby is growing and developing normally. How can unhealthy weight gain affect me and my baby? Gaining too much weight during pregnancy can lead to pregnancy complications, such as:  A temporary form of diabetes that develops during pregnancy (gestational diabetes).  High blood pressure during pregnancy and protein in your urine (preeclampsia).  High blood pressure during pregnancy without protein in your urine (gestational hypertension).  Your baby having a high weight at birth, which may: ? Raise your risk of having a more difficult delivery or a surgical delivery (cesarean delivery, or C-section). ? Raise your child's risk of developing obesity during childhood. Not gaining enough weight can be life-threatening for your baby, and it may raise your baby's chances  of:  Being born early (preterm).  Growing more slowly than normal during pregnancy (growth restriction).  Having a low weight at birth. What actions can I take to gain a healthy amount of weight during pregnancy? General instructions  Keep track of your weight gain during pregnancy.  Take over-the-counter and prescription medicines only as told by your health care provider. Take all prenatal supplements as directed.  Keep all health care visits during pregnancy (prenatal visits). These visits are a good time to discuss your weight gain. Your health care provider will weigh you at each visit to make sure you are gaining a healthy amount of weight. Nutrition   Eat a balanced, nutrient-rich diet. Eat plenty of: ? Fruits and vegetables, such as berries and broccoli. ? Whole grains, such as millet, barley, whole-wheat breads and cereals, and oatmeal. ? Low-fat dairy products or non-dairy products such as almond milk or rice milk. ? Protein foods, such as lean meat, chicken, eggs, and legumes (such as peas, beans, soybeans, and lentils).  Avoid foods that are fried or have a lot of fat, salt (sodium), or sugar.  Drink enough fluid to keep your urine pale yellow.  Choose healthy snack and drink options when you are at work or on the go: ? Drink water. Avoid soda, sports drinks, and juices that have added sugar. ? Avoid drinks with caffeine, such as coffee and energy drinks. ? Eat snacks that are high in protein, such as nuts, protein bars, and low-fat yogurt. ? Carry convenient snacks in your purse that do not need refrigeration, such as a pack of   trail mix, an apple, or a granola bar.  If you need help improving your diet, work with a health care provider or a diet and nutrition specialist (dietitian). Activity   Exercise regularly, as told by your health care provider. ? If you were active before becoming pregnant, you may be able to continue your regular fitness activities. ? If  you were not active before pregnancy, you may gradually build up to exercising for 30 or more minutes on most days of the week. This may include walking, swimming, or yoga.  Ask your health care provider what activities are safe for you. Talk with your health care provider about whether you may need to be excused from certain school or work activities. Where to find more information Learn more about managing your weight gain during pregnancy from:  American Pregnancy Association: www.americanpregnancy.org  [place] Department of Agriculture pregnancy weight gain calculator: Former[employer] Summary  Too much weight gain during pregnancy can lead to complications for you and your baby.  Find out your pre-pregnancy BMI to determine how much weight gain is healthy for you.  Eat nutritious foods and stay active.  Keep all of your prenatal visits as told by your health care provider. This information is not intended to replace advice given to you by your health care provider. Make sure you discuss any questions you have with your health care provider. Document Revised: 10/09/2018 Document Reviewed: 10/06/2016 Elsevier Patient Education  [place].   Exercise During Pregnancy Exercise is an important part of being healthy for people of all ages. Exercise improves the function of your heart and lungs and helps you maintain strength, flexibility, and a healthy body weight. Exercise also boosts energy levels and elevates mood. Most women should exercise regularly during pregnancy. In rare cases, women with certain medical conditions or complications may be asked to limit or avoid exercise during pregnancy. How does this affect me? Along with maintaining general strength and flexibility, exercising during pregnancy can help:  Keep strength in muscles that are used during labor and childbirth.  Decrease low back pain.  Reduce symptoms of depression.  Control weight gain during  pregnancy.  Reduce the risk of needing insulin if you develop diabetes during pregnancy.  Decrease the risk of cesarean delivery.  Speed up your recovery after giving birth. How does this affect my baby? Exercise can help you have a healthy pregnancy. Exercise does not cause premature birth. It will not cause your baby to weigh less at birth. What exercises can I do? Many exercises are safe for you to do during pregnancy. Do a variety of exercises that safely increase your heart and breathing rates and help you build and maintain muscle strength. Do exercises exactly as told by your health care provider. You may do these exercises:  Walking or hiking.  Swimming.  Water aerobics.  Riding a stationary bike.  Strength training.  Modified yoga or Pilates. Tell your instructor that you are pregnant. Avoid overstretching, and avoid lying on your back for long periods of time.  Running or jogging. Only choose this type of exercise if you: ? Ran or jogged regularly before your pregnancy. ? Can run or jog and still talk in complete sentences. What exercises should I avoid? Depending on your level of fitness and whether you exercised regularly before your pregnancy, you may be told to limit high-intensity exercise. You can tell that you are exercising at a high intensity if you are breathing much harder and faster and  cannot hold a conversation while exercising. You must avoid:  Contact sports.  Activities that put you at risk for falling on or being hit in the belly, such as downhill skiing, water skiing, surfing, rock climbing, cycling, gymnastics, and horseback riding.  Scuba diving.  Skydiving.  Yoga or Pilates in a room that is heated to high temperatures.  Jogging or running, unless you ran or jogged regularly before your pregnancy. While jogging or running, you should always be able to talk in full sentences. Do not run or jog so fast that you are unable to have a  conversation.  Do not exercise at more than 6,000 feet above sea level (high elevation) if you are not used to exercising at high elevation. How do I exercise in a safe way?   Avoid overheating. Do not exercise in very high temperatures.  Wear loose-fitting, breathable clothes.  Avoid dehydration. Drink enough water before, during, and after exercise to keep your urine pale yellow.  Avoid overstretching. Because of hormone changes during pregnancy, it is easy to overstretch muscles, tendons, and ligaments during pregnancy.  Start slowly and ask your health care provider to recommend the types of exercise that are safe for you.  Do not exercise to lose weight. Follow these instructions at home:  Exercise on most days or all days of the week. Try to exercise for 30 minutes a day, 5 days a week, unless your health care provider tells you not to.  If you actively exercised before your pregnancy and you are healthy, your health care provider may tell you to continue to do moderate to high-intensity exercise.  If you are just starting to exercise or did not exercise much before your pregnancy, your health care provider may tell you to do low to moderate-intensity exercise. Questions to ask your health care provider  Is exercise safe for me?  What are signs that I should stop exercising?  Does my health condition mean that I should not exercise during pregnancy?  When should I avoid exercising during pregnancy? Stop exercising and contact a health care provider if: You have any unusual symptoms, such as:  Mild contractions of the uterus or cramps in the abdomen.  Dizziness that does not go away when you rest. Stop exercising and get help right away if: You have any unusual symptoms, such as:  Sudden, severe pain in your low back or your belly.  Mild contractions of the uterus or cramps in the abdomen that do not improve with rest and drinking fluids.  Chest pain.  Bleeding or  fluid leaking from your vagina.  Shortness of breath. These symptoms may represent a serious problem that is an emergency. Do not wait to see if the symptoms will go away. Get medical help right away. Call your local emergency services (911 in the [place] Do not drive yourself to the hospital. Summary  Most women should exercise regularly throughout pregnancy. In rare cases, women with certain medical conditions or complications may be asked to limit or avoid exercise during pregnancy.  Do not exercise to lose weight during pregnancy.  Your health care provider will tell you what level of physical activity is right for you.  Stop exercising and contact a health care provider if you have mild contractions of the uterus or cramps in the abdomen. Get help right away if these contractions or cramps do not improve with rest and drinking fluids.  Stop exercising and get help right away if you have sudden, severe  pain in your low back or belly, chest pain, shortness of breath, or bleeding or leaking of fluid from your vagina. This information is not intended to replace advice given to you by your health care provider. Make sure you discuss any questions you have with your health care provider. Document Revised: 05/09/2018 Document Reviewed: 02/20/2018 Elsevier Patient Education  2020 Elsevier Inc.   Common Medications Safe in Pregnancy  Acne:      Constipation:  Benzoyl Peroxide     Colace  Clindamycin      Dulcolax Suppository  Topica Erythromycin     Fibercon  Salicylic Acid      Metamucil         Miralax AVOID:        Senakot   Accutane    Cough:  Retin-A       Cough Drops  Tetracycline      Phenergan w/ Codeine if Rx  Minocycline      Robitussin (Plain & DM)  Antibiotics:     Crabs/Lice:  Ceclor       RID  Cephalosporins    AVOID:  E-Mycins      Kwell  Keflex  Macrobid/Macrodantin   Diarrhea:  Penicillin      Kao-Pectate  Zithromax      Imodium AD         PUSH  FLUIDS AVOID:       Cipro     Fever:  Tetracycline      Tylenol (Regular or Extra  Minocycline       Strength)  Levaquin      Extra Strength-Do not          Exceed 8 tabs/24 hrs Caffeine:        <200mg/day (equiv. To 1 cup of coffee or  approx. 3 12 oz sodas)         Gas: Cold/Hayfever:       Gas-X  Benadryl      Mylicon  Claritin       Phazyme  **Claritin-D        Chlor-Trimeton    Headaches:  Dimetapp      ASA-Free Excedrin  Drixoral-Non-Drowsy     Cold Compress  Mucinex (Guaifenasin)     Tylenol (Regular or Extra  Sudafed/Sudafed-12 Hour     Strength)  **Sudafed PE Pseudoephedrine   Tylenol Cold & Sinus     Vicks Vapor Rub  Zyrtec  **AVOID if Problems With Blood Pressure         Heartburn: Avoid lying down for at least 1 hour after meals  Aciphex      Maalox     Rash:  Milk of Magnesia     Benadryl    Mylanta       1% Hydrocortisone Cream  Pepcid  Pepcid Complete   Sleep Aids:  Prevacid      Ambien   Prilosec       Benadryl  Rolaids       Chamomile Tea  Tums (Limit 4/day)     Unisom         Tylenol PM         Warm milk-add vanilla or  Hemorrhoids:       Sugar for taste  Anusol/Anusol H.C.  (RX: Analapram 2.5%)  Sugar Substitutes:  Hydrocortisone OTC     Ok in moderation  Preparation H      Tucks        Vaseline lotion applied to tissue with wiping      Herpes:     Throat:  Acyclovir      Oragel  Famvir  Valtrex     Vaccines:         Flu Shot Leg Cramps:       *Gardasil  Benadryl      Hepatitis A         Hepatitis B Nasal Spray:       Pneumovax  Saline Nasal Spray     Polio Booster         Tetanus Nausea:       Tuberculosis test or PPD  Vitamin B6 25 mg TID   AVOID:    Dramamine      *Gardasil  Emetrol       Live Poliovirus  Ginger Root 250 mg QID    MMR (measles, mumps &  High Complex Carbs @ Bedtime    rebella)  Sea Bands-Accupressure    Varicella (Chickenpox)  Unisom 1/2 tab TID     *No known complications           If received  before Pain:         Known pregnancy;   Darvocet       Resume series after  Lortab        Delivery  Percocet    Yeast:   Tramadol      Femstat  Tylenol 3      Gyne-lotrimin  Ultram       Monistat  Vicodin           MISC:         All Sunscreens           Hair Coloring/highlights          Insect Repellant's          (Including DEET)         Mystic Tans   Fetal Movement Counts Patient Name: ________________________________________________ Patient Due Date: ____________________ What is a fetal movement count?  A fetal movement count is the number of times that you feel your baby move during a certain amount of time. This may also be called a fetal kick count. A fetal movement count is recommended for every pregnant woman. You may be asked to start counting fetal movements as early as week 28 of your pregnancy. Pay attention to when your baby is most active. You may notice your baby's sleep and wake cycles. You may also notice things that make your baby move more. You should do a fetal movement count:  When your baby is normally most active.  At the same time each day. A good time to count movements is while you are resting, after having something to eat and drink. How do I count fetal movements? 1. Find a quiet, comfortable area. Sit, or lie down on your side. 2. Write down the date, the start time and stop time, and the number of movements that you felt between those two times. Take this information with you to your health care visits. 3. Write down your start time when you feel the first movement. 4. Count kicks, flutters, swishes, rolls, and jabs. You should feel at least 10 movements. 5. You may stop counting after you have felt 10 movements, or if you have been counting for 2 hours. Write down the stop time. 6. If you do not feel 10 movements in 2 hours, contact your health care provider for further instructions. Your health care provider may want to do additional tests to assess  your baby's well-being. Contact a health care provider if:  You feel fewer than 10 movements in 2 hours.  Your baby is not moving like he or she usually does. Date: ____________ Start time: ____________ Stop time: ____________ Movements: ____________ Date: ____________ Start time: ____________ Stop time: ____________ Movements: ____________ Date: ____________ Start time: ____________ Stop time: ____________ Movements: ____________ Date: ____________ Start time: ____________ Stop time: ____________ Movements: ____________ Date: ____________ Start time: ____________ Stop time: ____________ Movements: ____________ Date: ____________ Start time: ____________ Stop time: ____________ Movements: ____________ Date: ____________ Start time: ____________ Stop time: ____________ Movements: ____________ Date: ____________ Start time: ____________ Stop time: ____________ Movements: ____________ Date: ____________ Start time: ____________ Stop time: ____________ Movements: ____________ This information is not intended to replace advice given to you by your health care provider. Make sure you discuss any questions you have with your health care provider. Document Revised: 09/05/2018 Document Reviewed: 09/05/2018 Elsevier Patient Education  [place].

## 2019-09-11 NOTE — Progress Notes (Signed)
ROB-Doing well, no questions or concerns. 28 week labs today, see orders. Third trimester handouts given. Breastfeeding education completed, see checklist. Consent given for blood transfusion. TDaP administered, see chart. Anticipatory guidance regarding course of prenatal care. Reviewed red flag symptoms and when to call. RTC x 2 weeks for ROB; RTC x 4 weeks for growth ultrasound and ROB or sooner if needed.   ULTRASOUND REPORT  Location: [facility]  Date of Service: 09/11/2019   Indications: growth  Findings:  Singleton intrauterine pregnancy is visualized with FHR at 174 BPM. Biometrics give an (U/S) Gestational age of [redacted]w[redacted]d and an (U/S) EDD of 12/03/2019; this correlates with the clinically established Estimated Date of Delivery: 11/25/19.  Fetal presentation is Variable.  Placenta: posterior. Grade: -- Marginal cord insertion  2 mm echogenic foci is still seen in fetal heart.  Growth percentile is 53. EFW: 1246 g ( 2 lbs 12 oz)   Impression: 1. [redacted]w[redacted]d Viable Singleton Intrauterine pregnancy previously established criteria. 2. Growth is 53 %ile.   3. Marginal CI 4. Echogenic foci in fetal heart as described above.  Recommendations: 1.Clinical correlation with the patient's History and Physical Exam.

## 2019-09-12 ENCOUNTER — Other Ambulatory Visit: Payer: Self-pay | Admitting: Certified Nurse Midwife

## 2019-09-12 ENCOUNTER — Encounter: Payer: Self-pay | Admitting: Certified Nurse Midwife

## 2019-09-12 DIAGNOSIS — R7309 Other abnormal glucose: Secondary | ICD-10-CM | POA: Insufficient documentation

## 2019-09-12 DIAGNOSIS — O09899 Supervision of other high risk pregnancies, unspecified trimester: Secondary | ICD-10-CM

## 2019-09-12 DIAGNOSIS — Z3A29 29 weeks gestation of pregnancy: Secondary | ICD-10-CM

## 2019-09-12 HISTORY — DX: Other abnormal glucose: R73.09

## 2019-09-12 LAB — CBC
Hematocrit: 33.2 % — ABNORMAL LOW (ref 34.0–46.6)
Hemoglobin: 11.1 g/dL (ref 11.1–15.9)
MCH: 28.5 pg (ref 26.6–33.0)
MCHC: 33.4 g/dL (ref 31.5–35.7)
MCV: 85 fL (ref 79–97)
Platelets: 220 10*3/uL (ref 150–450)
RBC: 3.9 x10E6/uL (ref 3.77–5.28)
RDW: 15.1 % (ref 11.7–15.4)
WBC: 5.7 10*3/uL (ref 3.4–10.8)

## 2019-09-12 LAB — RPR: RPR Ser Ql: NONREACTIVE

## 2019-09-12 LAB — GLUCOSE, 1 HOUR GESTATIONAL: Gestational Diabetes Screen: 272 mg/dL — ABNORMAL HIGH (ref 65–139)

## 2019-09-12 NOTE — Progress Notes (Signed)
Referral to Medical Nutritional Therapy, see orders.   Kathleen Barnes, CNM [facility], [facility] 09/12/19 6:06 PM

## 2019-09-19 ENCOUNTER — Other Ambulatory Visit: Payer: Self-pay

## 2019-09-19 ENCOUNTER — Encounter: Payer: No Typology Code available for payment source | Attending: Certified Nurse Midwife | Admitting: Dietician

## 2019-09-19 ENCOUNTER — Encounter: Payer: Self-pay | Admitting: Dietician

## 2019-09-19 VITALS — Ht 60.0 in | Wt 169.4 lb

## 2019-09-19 DIAGNOSIS — O09893 Supervision of other high risk pregnancies, third trimester: Secondary | ICD-10-CM | POA: Insufficient documentation

## 2019-09-19 DIAGNOSIS — O09899 Supervision of other high risk pregnancies, unspecified trimester: Secondary | ICD-10-CM

## 2019-09-19 DIAGNOSIS — O24419 Gestational diabetes mellitus in pregnancy, unspecified control: Secondary | ICD-10-CM | POA: Insufficient documentation

## 2019-09-19 DIAGNOSIS — Z3A29 29 weeks gestation of pregnancy: Secondary | ICD-10-CM | POA: Insufficient documentation

## 2019-09-19 DIAGNOSIS — R7309 Other abnormal glucose: Secondary | ICD-10-CM

## 2019-09-19 DIAGNOSIS — R739 Hyperglycemia, unspecified: Secondary | ICD-10-CM

## 2019-09-19 NOTE — Patient Instructions (Signed)
   Control portions of starchy foods with meals to 1 cup (fist-size) or less. Include generous portions of low-carb veggies and a protein food.   Make sure to have a protein source in smoothies -- [place] yogurt, protein powder or drink, nuts or peanut butter. Limit fruit and especially fruit juice. Add veggies like cucumbers, avocado, spinach, or carrots to help control sugar content.   When ordering a smoothie from a restaurant, get a low sugar version -- ask for it if needed.   Try diet snapple,Healthy Balance, Nature's Twist sugar free, Nestle Splash water, or propel water for low sugar drink options.   Taking short walks or other light exercise for 15 minutes after eating can help keep blood sugar in good control.

## 2019-09-19 NOTE — Progress Notes (Signed)
Medical Nutrition Therapy: Visit start time: 0900  end time: 1000  Assessment:  Diagnosis: elevated blood glucose with pregnancy Past medical history: none significant Psychosocial issues/ stress concerns: none  Preferred learning method:  . Hands-on   Current weight: 169.4lbs Height: 5'0" Medications, supplements: pre-natal vitamins  Progress and evaluation:   Patient had 1-hour glucose tolerance test with result of 272. HbA1C measured 05/2019 was 5.6%. She denies any previous elevated BGs.   She reports some reduction in sugar intake, has switched from drinking slushy drinks to fruit smoothies, and snacking on fruits rather than sweets.  Physical activity: walking 60 minutes, one time per week  Dietary Intake:  Usual eating pattern includes 2-3 meals and 1-2 snacks per day. Dining out frequency: 3-5 meals per week.  Breakfast: smoothie, later bagel with cream cheese Snack: none Lunch: sandwich with [place]  Snack: popcorn, pretzels, dry cereal, fruit Supper: pasta; occasionally fried food with veg, mac and cheese Snack: occ wakes up during the night ie fruit or ice cream sandwich Beverages: water several large cups, snapple 2 bottles daily  Nutrition Care Education: Topics covered:  Basic nutrition: basic food groups, appropriate nutrient balance, appropriate meal and snack schedule, general nutrition guidelines    Diabetes prevention:  appropriate meal and snack schedule, appropriate carb intake and balance, healthy carb choices, role of fiber, protein, fat; used plate method for basic meal planning instruction; discussed low sugar beverage options; discussed role of exercise in BG control Other:  Food safety for pregnancy  Nutritional Diagnosis:  [place]-2.2 Altered nutrition-related laboratory As related to elevated glucose tolerance test.  As evidenced by glucose reading of 272 at 1 hour test, with patient at [redacted] weeks gestational age.  Intervention:   Instruction and  discussion as noted above.  Patient has been making positive diet changes, and is motivated to continue.  Established goals with input from patient.  Patient declined scheduling MNT follow-up at this time, but will schedule later if needed.  Education Materials given:  . General diet guidelines for Diabetes (prevention) . Plate Planner with food lists . Sample menus . Food Safety . Goals/ instructions   Learner/ who was taught:  . Patient    Level of understanding: Marland Kitchen Verbalizes/ demonstrates competency   Demonstrated degree of understanding via:   Teach back Learning barriers: . None  Willingness to learn/ readiness for change: . Eager, change in progress   Monitoring and Evaluation:  Dietary intake, exercise, BG control, and body weight      follow up: prn

## 2019-09-24 ENCOUNTER — Ambulatory Visit (INDEPENDENT_AMBULATORY_CARE_PROVIDER_SITE_OTHER): Payer: No Typology Code available for payment source | Admitting: Certified Nurse Midwife

## 2019-09-24 ENCOUNTER — Encounter: Payer: Self-pay | Admitting: Certified Nurse Midwife

## 2019-09-24 ENCOUNTER — Other Ambulatory Visit: Payer: Self-pay

## 2019-09-24 VITALS — BP 115/75 | HR 112 | Wt 169.5 lb

## 2019-09-24 DIAGNOSIS — O24419 Gestational diabetes mellitus in pregnancy, unspecified control: Secondary | ICD-10-CM

## 2019-09-24 DIAGNOSIS — Z3A31 31 weeks gestation of pregnancy: Secondary | ICD-10-CM

## 2019-09-24 DIAGNOSIS — Z3403 Encounter for supervision of normal first pregnancy, third trimester: Secondary | ICD-10-CM

## 2019-09-24 LAB — POCT URINALYSIS DIPSTICK OB
Bilirubin, UA: NEGATIVE
Blood, UA: NEGATIVE
Glucose, UA: NEGATIVE
Leukocytes, UA: NEGATIVE
Nitrite, UA: NEGATIVE
POC,PROTEIN,UA: NEGATIVE
Spec Grav, UA: >=1.03 — AB (ref 1.010–1.025)
Urobilinogen, UA: 0.2 E.U./dL
pH, UA: 6 (ref 5.0–8.0)

## 2019-09-24 NOTE — Progress Notes (Signed)
ROB doing well. Feels good fetal movement. Pt states that she saw the nutritionist but they did not due diabetes education. They talked with her about her diet. They did not teacher her about using a meter and checking her blood sugars. Referral placed for diabetes education stat. Pt instructed to return with one week of BS log to review with Midwife she verbalizes and agrees to plan.   Doreene Burke, CNM

## 2019-09-24 NOTE — Patient Instructions (Signed)
[place] [facility]  [facility]  [address], [place], [place] [address]  Phone: [phone]  [facility] (second location)  [address] [place], [place] [address]  Phone: [phone]  [facility] ([place]) [address], [place], [place] [address] Phone: [phone]  [facility]  [address] [place], [place] [address]  Phone: [phone] 

## 2019-09-26 ENCOUNTER — Telehealth: Payer: Self-pay

## 2019-09-26 ENCOUNTER — Other Ambulatory Visit: Payer: Self-pay | Admitting: Certified Nurse Midwife

## 2019-09-26 ENCOUNTER — Other Ambulatory Visit: Payer: Self-pay

## 2019-09-26 ENCOUNTER — Encounter: Payer: No Typology Code available for payment source | Admitting: *Deleted

## 2019-09-26 ENCOUNTER — Encounter: Payer: Self-pay | Admitting: *Deleted

## 2019-09-26 ENCOUNTER — Telehealth: Payer: Self-pay | Admitting: Certified Nurse Midwife

## 2019-09-26 VITALS — BP 108/78 | Ht 60.0 in | Wt 171.9 lb

## 2019-09-26 DIAGNOSIS — O24419 Gestational diabetes mellitus in pregnancy, unspecified control: Secondary | ICD-10-CM

## 2019-09-26 DIAGNOSIS — O24410 Gestational diabetes mellitus in pregnancy, diet controlled: Secondary | ICD-10-CM

## 2019-09-26 MED ORDER — ACCU-CHEK NANO SMARTVIEW W/DEVICE KIT: 1.0000 | PACK | 0 refills | Status: DC

## 2019-09-26 MED ORDER — ACCU-CHEK SMARTVIEW VI STRP: ORAL_STRIP | 12 refills | Status: DC

## 2019-09-26 NOTE — Telephone Encounter (Signed)
Called Pt per Serafina Royals, CNM-  Unable to leave vm due to vm not set up.

## 2019-09-26 NOTE — Patient Instructions (Signed)
Read booklet on Gestational Diabetes Follow Gestational Meal Planning Guidelines Avid cold cereal for breakfast Avoid fruit juice Check blood sugars 4 x day - before breakfast and 2 hrs after every meal and record  Bring blood sugar log to all appointments Call MD for prescription for meter strips and lancets Strips  Accu-Chek Guide Lancets   Accu-Chek Softclix Purchase urine ketone strips if instructed by MD and check urine ketones every am:  If + increase bedtime snack to 1 protein and 2 carbohydrate servings Walk 20-30 minutes at least 5 x week if permitted by MD

## 2019-09-26 NOTE — Telephone Encounter (Signed)
Patient walked into the office after her visit with [facility] and they wanted her to have meter strips and lancets called into her pharmacy. Could you please advise?  Patient asked me how long it would take to get the prescription called into her pharmacy and I informed her that I would allow 24-48 hours for the provider to get the message and have the prescription called into the pharmacy. Patient verbalized understanding.

## 2019-09-26 NOTE — Progress Notes (Signed)
Rx: Glucometer and testing strips, see orders.    Serafina Royals, CNM [facility], [facility] 09/26/19 3:50 PM

## 2019-09-26 NOTE — Telephone Encounter (Signed)
mychart message sent to patient

## 2019-09-26 NOTE — Progress Notes (Signed)
Diabetes Self-Management Education  Visit Type: First/Initial  Appt. Start Time: 1315 Appt. End Time: 1515  09/26/2019  [name], identified by name and date of birth, is a 19 y.o. female with a diagnosis of Diabetes: Gestational Diabetes.   ASSESSMENT  Blood pressure 108/78, height 5' (1.524 m), weight 171 lb 14.4 oz (78 kg), last menstrual period 02/18/2019, estimated date of delivery 11/25/2019 Body mass index is 33.57 kg/m.   Diabetes Self-Management Education - 09/26/19 1545      Visit Information   Visit Type First/Initial      Initial Visit   Diabetes Type Gestational Diabetes    Are you currently following a meal plan? No    Are you taking your medications as prescribed? Yes    Date Diagnosed this month      Health Coping   How would you rate your overall health? Good      Psychosocial Assessment   Patient Belief/Attitude about Diabetes Other (comment)   "due to pregnancy"   Self-care barriers None    Self-management support Doctor's office;Family    Patient Concerns Nutrition/Meal planning;Glycemic Control    Special Needs None    Preferred Learning Style Hands on    Learning Readiness Ready    How often do you need to have someone help you when you read instructions, pamphlets, or other written materials from your doctor or pharmacy? 1 - Never    What is the last grade level you completed in school? hs diploma      Pre-Education Assessment   Patient understands the diabetes disease and treatment process. Needs Instruction    Patient understands incorporating nutritional management into lifestyle. Needs Review    Patient undertands incorporating physical activity into lifestyle. Needs Review    Patient understands using medications safely. Needs Instruction    Patient understands monitoring blood glucose, interpreting and using results Needs Instruction    Patient understands prevention, detection, and treatment of acute complications. Needs Instruction      Patient understands prevention, detection, and treatment of chronic complications. Needs Instruction    Patient understands how to develop strategies to address psychosocial issues. Needs Instruction    Patient understands how to develop strategies to promote health/change behavior. Needs Instruction      Complications   Last HgB A1C per patient/outside source 5.6 %   06/16/2019   How often do you check your blood sugar? 0 times/day (not testing)   Provided Accu-Chek Guide Me meter and instructed on use. BG upone return demonstration was 123 mg/dL at 4:31 pm - 30 minutes after eating peanut butter crackers during class.   Have you had a dilated eye exam in the past 12 months? No    Have you had a dental exam in the past 12 months? Yes    Are you checking your feet? No      Dietary Intake   Breakfast egg and sausage burrito; oatmeal; cereal and milk; biscuit    Snack (morning) chips, popcorn, crackers, goldfish, fruit - watermelon, pineapple, grapes    Lunch left-overs    Snack (afternoon) same as morning    Dinner beef, chicken, fish, spaghetti, potatoes, peas, green beans, pasta, [place], greens, rutabagars, broccoli, cuccumbers    Snack (evening) same as morning    Beverage(s) water, juice      Exercise   Exercise Type Light (walking / raking leaves)   yoga videos, using exercise ball   How many days per week to you exercise? 1.5  How many minutes per day do you exercise? 60    Total minutes per week of exercise 90      Patient Education   Previous Diabetes Education Yes (please comment)   Pt came to see the dietitian last week   Disease state  Definition of diabetes, type 1 and 2, and the diagnosis of diabetes;Factors that contribute to the development of diabetes    Nutrition management  Role of diet in the treatment of diabetes and the relationship between the three main macronutrients and blood glucose level;Food label reading, portion sizes and measuring food.;Reviewed blood  glucose goals for pre and post meals and how to evaluate the patients' food intake on their blood glucose level.    Physical activity and exercise  Role of exercise on diabetes management, blood pressure control and cardiac health.    Medications Other (comment)   Limited use of oral medications during pregnancy and potential for insulin.   Monitoring Taught/evaluated SMBG meter.;Purpose and frequency of SMBG.;Taught/discussed recording of test results and interpretation of SMBG.;Ketone testing, when, how.    Chronic complications Relationship between chronic complications and blood glucose control    Psychosocial adjustment Identified and addressed patients feelings and concerns about diabetes    Preconception care Pregnancy and GDM  Role of pre-pregnancy blood glucose control on the development of the fetus;Reviewed with patient blood glucose goals with pregnancy;Role of family planning for patients with diabetes      Individualized Goals (developed by patient)   Reducing Risk Other (comment)   improve blood sugars     Post-Education Assessment   Patient understands the diabetes disease and treatment process. Needs Review    Patient understands incorporating nutritional management into lifestyle. Needs Review    Patient undertands incorporating physical activity into lifestyle. Demonstrates understanding / competency    Patient understands using medications safely. Needs Review    Patient understands monitoring blood glucose, interpreting and using results Demonstrates understanding / competency    Patient understands prevention, detection, and treatment of acute complications. Needs Review    Patient understands prevention, detection, and treatment of chronic complications. Demonstrates understanding / competency    Patient understands how to develop strategies to address psychosocial issues. Demonstrates understanding / competency    Patient understands how to develop strategies to promote  health/change behavior. Demonstrates understanding / competency      Outcomes   Expected Outcomes Demonstrated interest in learning. Expect positive outcomes    Program Status Completed           Individualized Plan for Diabetes Self-Management Training:   Learning Objective:  Patient will have a greater understanding of diabetes self-management. Patient education plan is to attend individual and/or group sessions per assessed needs and concerns.   Plan:   Patient Instructions  Read booklet on Gestational Diabetes Follow Gestational Meal Planning Guidelines Avid cold cereal for breakfast Avoid fruit juice Check blood sugars 4 x day - before breakfast and 2 hrs after every meal and record  Bring blood sugar log to all appointments Call MD for prescription for meter strips and lancets Strips  Accu-Chek Guide Lancets   Accu-Chek Softclix Purchase urine ketone strips if instructed by MD and check urine ketones every am:  If + increase bedtime snack to 1 protein and 2 carbohydrate servings Walk 20-30 minutes at least 5 x week if permitted by MD  Expected Outcomes:  Demonstrated interest in learning. Expect positive outcomes  Education material provided:  Gestational Booklet Gestational Meal Planning Guidelines Simple  Meal Plan Viewed Gestational Diabetes Video Meter - Accu-Chek Guide Me Goals for a Healthy Pregnancy  If problems or questions, patient to contact team via:  Sharion Settler, RN, CCM, CDCES [phone]  Future DSME appointment: PRN

## 2019-09-26 NOTE — Telephone Encounter (Signed)
Call received from front desk- Informed patient that glucometer and testing strips were sent to her pharmacy on file by Serafina Royals, CNM. Pt verbalized understanding.

## 2019-09-26 NOTE — Telephone Encounter (Signed)
Please contact patient. Glucometer and testing strips sent to pharmacy on file. Thanks, Serafina Royals, CNM

## 2019-10-09 ENCOUNTER — Other Ambulatory Visit: Payer: Self-pay

## 2019-10-09 ENCOUNTER — Encounter: Payer: Self-pay | Admitting: Certified Nurse Midwife

## 2019-10-09 ENCOUNTER — Ambulatory Visit (INDEPENDENT_AMBULATORY_CARE_PROVIDER_SITE_OTHER): Payer: No Typology Code available for payment source | Admitting: Certified Nurse Midwife

## 2019-10-09 ENCOUNTER — Ambulatory Visit (INDEPENDENT_AMBULATORY_CARE_PROVIDER_SITE_OTHER): Payer: No Typology Code available for payment source

## 2019-10-09 VITALS — BP 105/74 | HR 104 | Wt 173.5 lb

## 2019-10-09 DIAGNOSIS — O43199 Other malformation of placenta, unspecified trimester: Secondary | ICD-10-CM | POA: Diagnosis not present

## 2019-10-09 DIAGNOSIS — Z3403 Encounter for supervision of normal first pregnancy, third trimester: Secondary | ICD-10-CM | POA: Diagnosis not present

## 2019-10-09 DIAGNOSIS — Z3A33 33 weeks gestation of pregnancy: Secondary | ICD-10-CM | POA: Diagnosis not present

## 2019-10-09 DIAGNOSIS — Z3A29 29 weeks gestation of pregnancy: Secondary | ICD-10-CM | POA: Diagnosis not present

## 2019-10-09 DIAGNOSIS — O24415 Gestational diabetes mellitus in pregnancy, controlled by oral hypoglycemic drugs: Secondary | ICD-10-CM

## 2019-10-09 LAB — POCT URINALYSIS DIPSTICK OB
Bilirubin, UA: NEGATIVE
Blood, UA: NEGATIVE
Glucose, UA: NEGATIVE
Ketones, UA: NEGATIVE
Leukocytes, UA: NEGATIVE
Nitrite, UA: NEGATIVE
POC,PROTEIN,UA: NEGATIVE
Spec Grav, UA: 1.025 (ref 1.010–1.025)
Urobilinogen, UA: 0.2 E.U./dL
pH, UA: 6 (ref 5.0–8.0)

## 2019-10-09 MED ORDER — GLYBURIDE 5 MG PO TABS: 5.0000 mg | ORAL_TABLET | Freq: Two times a day (BID) | ORAL | 1 refills | Status: DC

## 2019-10-09 NOTE — Progress Notes (Signed)
ROB-Doing well. Growth ultrasound and AFI within normal limits; see below findings reviewed patient. Blood sugar log discussed in great detail, all finding elevated. Dr. Logan Bores consulted; will start Glyburide 5 mg PO BID, see orders. A1c today, see chart. Encouraged twice daily kick counts. Reviewed red flag symptoms and when to call. RTC x 1 week or sooner if needed.   ULTRASOUND REPORT  Location: [facility] Date of Service: 10/09/2019   Indications:growth/afi   Findings:  Kathleen Barnes intrauterine pregnancy is visualized with FHR at 150 BPM. Fetal presentation is Cephalic.  Placenta: posterior. Grade: 1. Marginal cord insertion. AFI: 18.9 cm  Growth percentile is 37. EFW: 2039 g ( 4 lbs 8 oz)   Impression: 1. [redacted]w[redacted]d Viable Singleton Intrauterine pregnancy previously established criteria. 2. Growth is 37 %ile.  AFI is 18.9 cm.  3. Echogenic foci is still seen in the fetal heart.  Recommendations: 1.Clinical correlation with the patient's History and Physical Exam.

## 2019-10-09 NOTE — Patient Instructions (Signed)
Fetal Movement Counts Patient Name: ________________________________________________ Patient Due Date: ____________________ What is a fetal movement count?  A fetal movement count is the number of times that you feel your baby move during a certain amount of time. This may also be called a fetal kick count. A fetal movement count is recommended for every pregnant woman. You may be asked to start counting fetal movements as early as week 28 of your pregnancy. Pay attention to when your baby is most active. You may notice your baby's sleep and wake cycles. You may also notice things that make your baby move more. You should do a fetal movement count:  When your baby is normally most active.  At the same time each day. A good time to count movements is while you are resting, after having something to eat and drink. How do I count fetal movements? 1. Find a quiet, comfortable area. Sit, or lie down on your side. 2. Write down the date, the start time and stop time, and the number of movements that you felt between those two times. Take this information with you to your health care visits. 3. Write down your start time when you feel the first movement. 4. Count kicks, flutters, swishes, rolls, and jabs. You should feel at least 10 movements. 5. You may stop counting after you have felt 10 movements, or if you have been counting for 2 hours. Write down the stop time. 6. If you do not feel 10 movements in 2 hours, contact your health care provider for further instructions. Your health care provider may want to do additional tests to assess your baby's well-being. Contact a health care provider if:  You feel fewer than 10 movements in 2 hours.  Your baby is not moving like he or she usually does. Date: ____________ Start time: ____________ Stop time: ____________ Movements: ____________ Date: ____________ Start time: ____________ Stop time: ____________ Movements: ____________ Date: ____________  Start time: ____________ Stop time: ____________ Movements: ____________ Date: ____________ Start time: ____________ Stop time: ____________ Movements: ____________ Date: ____________ Start time: ____________ Stop time: ____________ Movements: ____________ Date: ____________ Start time: ____________ Stop time: ____________ Movements: ____________ Date: ____________ Start time: ____________ Stop time: ____________ Movements: ____________ Date: ____________ Start time: ____________ Stop time: ____________ Movements: ____________ Date: ____________ Start time: ____________ Stop time: ____________ Movements: ____________ This information is not intended to replace advice given to you by your health care provider. Make sure you discuss any questions you have with your health care provider. Document Revised: 09/05/2018 Document Reviewed: 09/05/2018 Elsevier Patient Education  [employer].   Glyburide tablets What is this medicine? GLYBURIDE (GLYE byoor ide) helps to treat type 2 diabetes. Treatment is combined with diet and exercise. The medicine helps your body to use insulin better. This medicine may be used for other purposes; ask your health care provider or pharmacist if you have questions. COMMON BRAND NAME(S): Diabeta, Glycron, Glynase PresTab, Micronase What should I tell my health care provider before I take this medicine? They need to know if you have any of these conditions:  diabetic ketoacidosis  glucose-6-phosphate dehydrogenase deficiency  heart disease  kidney disease  liver disease  severe infection or injury  thyroid disease  an unusual or allergic reaction to glyburide, sulfa drugs, other medicines, foods, dyes, or preservatives  pregnant or trying to get pregnant  breast-feeding How should I use this medicine? Take this medicine by mouth with a glass of water. Follow the directions on the prescription label. If  you take this medicine once a day, take it  with breakfast or the first main meal of the day. Take your medicine at the same time each day. Do not take more often than directed. Talk to your pediatrician regarding the use of this medicine in children. Special care may be needed. Elderly patients over 10 years old may have a stronger reaction and need a smaller dose. Overdosage: If you think you have taken too much of this medicine contact a poison control center or emergency room at once. NOTE: This medicine is only for you. Do not share this medicine with others. What if I miss a dose? If you miss a dose, take it as soon as you can. If it is almost time for your next dose, take only that dose. Do not take double or extra doses. What may interact with this medicine? Do not take this medicine with any of the following medications:  bosentan This medicine may also interact with the following medications:  cisapride  clarithromycin  colesevelam  metoclopramide  miconazole  probenecid  topiramate  warfarin Many medications may cause an increase or decrease in blood sugar, these include:  alcohol containing beverages  angiotensin converting enzyme inhibitors like enalapril, captopril, and lisinopril  aspirin and aspirin-like drugs  beta-blockers like metoprolol and propranolol  calcium channel blockers like diltiazem and verapamil  chloramphenicol  chromium  female hormones, like estrogens or progestins and birth control pills  fluoxetine  heart medicines like disopyramide  female hormones or anabolic steroids  medicines called MAO Inhibitors like Nardil, Parnate, Marplan, Eldepryl  medicines for allergies, asthma, cold, or cough  medicines for mental problems  medicines for weight loss  niacin  NSAIDs, medicines for pain and inflammation, like ibuprofen or naproxen  pentamidine  phenytoin  probenecid  quinolone antibiotics like ciprofloxacin, levofloxacin, ofloxacin  some herbal dietary  supplements  steroid medicines like prednisone or cortisone  thyroid medicine  water pills or diuretics This list may not describe all possible interactions. Give your health care provider a list of all the medicines, herbs, non-prescription drugs, or dietary supplements you use. Also tell them if you smoke, drink alcohol, or use illegal drugs. Some items may interact with your medicine. What should I watch for while using this medicine? Visit your doctor or health care professional for regular checks on your progress. A test called the HbA1C (A1C) will be monitored. This is a simple blood test. It measures your blood sugar control over the last 2 to 3 months. You will receive this test every 3 to 6 months. Learn how to check your blood sugar. Learn the symptoms of low and high blood sugar and how to manage them. Always carry a quick-source of sugar with you in case you have symptoms of low blood sugar. Examples include hard sugar candy or glucose tablets. Make sure others know that you can choke if you eat or drink when you develop serious symptoms of low blood sugar, such as seizures or unconsciousness. They must get medical help at once. Tell your doctor or health care professional if you have high blood sugar. You might need to change the dose of your medicine. If you are sick or exercising more than usual, you might need to change the dose of your medicine. Do not skip meals. Ask your doctor or health care professional if you should avoid alcohol. Many nonprescription cough and cold products contain sugar or alcohol. These can affect blood sugar. This medicine can make you  more sensitive to the sun. Keep out of the sun. If you cannot avoid being in the sun, wear protective clothing and use sunscreen. Do not use sun lamps or tanning beds/booths. Wear a medical ID bracelet or chain, and carry a card that describes your disease and details of your medicine and dosage times. This medicine may cause  a decrease in Co-Enyzme Q-10. You should make sure that you get enough Co-Enzyme Q-10 while you are taking this medicine. Discuss the foods you eat and the vitamins you take with your health care professional. What side effects may I notice from receiving this medicine? Side effects that you should report to your doctor or health care professional as soon as possible:  allergic reactions like skin rash, itching or hives, swelling of the face, lips, or tongue  breathing problems  dark urine  fever, chills, sore throat  signs and symptoms of low blood sugar such as feeling anxious, confusion, dizziness, increased hunger, unusually weak or tired, sweating, shakiness, cold, irritable, headache, blurred vision, fast heartbeat, loss of consciousness  unusual bleeding or bruising  yellowing of the eyes or skin Side effects that usually do not require medical attention (report to your doctor or health care professional if they continue or are bothersome):  diarrhea  dizziness  gas  headache  nausea  upset stomach This list may not describe all possible side effects. Call your doctor for medical advice about side effects. You may report side effects to FDA at 1-800-FDA-1088. Where should I keep my medicine? Keep out of the reach of children. Store at room temperature between 15 and 30 degrees C (59 and 86 degrees F). Throw away any unused medicine after the expiration date. NOTE: This sheet is a summary. It may not cover all possible information. If you have questions about this medicine, talk to your doctor, pharmacist, or health care provider.  2020 Elsevier/Gold Standard (2017-02-14 10:23:18)   Gestational Diabetes Mellitus, Self Care Caring for yourself after you have been diagnosed with gestational diabetes (gestational diabetes mellitus) means keeping your blood sugar (glucose) under control. You can do that with a balance of:  Nutrition.  Exercise.  Lifestyle  changes.  Medicines or insulin, if necessary.  Support from your team of health care providers and others. The following information explains what you need to know to manage your gestational diabetes at home. What are the risks? If gestational diabetes is treated, it is unlikely to cause problems. If it is not controlled with treatment, it may cause problems during labor and delivery, and some of those problems can be harmful to the unborn baby (fetus) and the mother. Uncontrolled gestational diabetes may also cause the newborn baby to have breathing problems and low blood glucose. Women who get gestational diabetes are more likely to develop it if they get pregnant again, and they are more likely to develop type 2 diabetes in the future. How to monitor blood glucose   Check your blood glucose every day during your pregnancy. Do this as often as told by your health care provider.  Contact your health care provider if your blood glucose is above your target for two tests in a row. Your health care provider will set individualized treatment goals for you. Generally, the goal of treatment is to maintain the following blood glucose levels during pregnancy:  Before meals (preprandial): at or below 95 mg/dL (5.3 mmol/L).  After meals (postprandial): ? One hour after a meal: at or below 140 mg/dL (7.8 mmol/L). ?  Two hours after a meal: at or below 120 mg/dL (6.7 mmol/L).  A1c (hemoglobin A1c) level: 6-6.5%. How to manage hyperglycemia and hypoglycemia Hyperglycemia symptoms Hyperglycemia, also called high blood glucose, occurs when blood glucose is too high. Make sure you know the early signs of hyperglycemia, such as:  Increased thirst.  Hunger.  Feeling very tired.  Needing to urinate more often than usual.  Blurry vision. Hypoglycemia symptoms Hypoglycemia, also called low blood glucose, occurs with a blood glucose level at or below 70 mg/dL (3.9 mmol/L). The risk for hypoglycemia  increases during or after exercise, during sleep, during illness, and when skipping meals or not eating for a long time (fasting). Symptoms may include:  Hunger.  Anxiety.  Sweating and feeling clammy.  Confusion.  Dizziness or feeling light-headed.  Sleepiness.  Nausea.  Increased heart rate.  Headache.  Blurry vision.  Irritability.  Tingling or numbness around the mouth, lips, or tongue.  A change in coordination.  Restless sleep.  Fainting.  Seizure. It is important to know the symptoms of hypoglycemia and treat it right away. Always have a 15-gram rapid-acting carbohydrate snack with you to treat low blood glucose. Family members and close friends should also know the symptoms and should understand how to treat hypoglycemia, in case you are not able to treat yourself. Treating hypoglycemia If you are alert and able to swallow safely, follow the 15:15 rule:  Take 15 grams of a rapid-acting carbohydrate. Talk with your health care provider about how much you should take.  Rapid-acting options include: ? Glucose pills (take 15 grams). ? 6-8 pieces of hard candy. ? 4-6 oz (120-150 mL) of fruit juice. ? 4-6 oz (120-150 mL) of regular (not diet) soda. ? 1 Tbsp (15 mL) honey or sugar.  Check your blood glucose 15 minutes after you take the carbohydrate.  If the repeat blood glucose level is still at or below 70 mg/dL (3.9 mmol/L), take 15 grams of a carbohydrate again.  If your blood glucose level does not increase above 70 mg/dL (3.9 mmol/L) after 3 tries, seek emergency medical care.  After your blood glucose level returns to normal, eat a meal or a snack within 1 hour. Treating severe hypoglycemia Severe hypoglycemia is when your blood glucose level is at or below 54 mg/dL (3 mmol/L). Severe hypoglycemia is an emergency. Do not wait to see if the symptoms will go away. Get medical help right away. Call your local emergency services (911 in the [place] If you  have severe hypoglycemia and you cannot eat or drink, you may need an injection of glucagon. A family member or close friend should learn how to check your blood glucose and how to give you a glucagon injection. Ask your health care provider if you need to have an emergency glucagon injection kit available. Severe hypoglycemia may need to be treated in a hospital. The treatment may include getting glucose through an IV. You may also need treatment for the cause of your hypoglycemia. Follow these instructions at home: Take diabetes medicines as told  If your health care provider prescribed insulin or diabetes medicines, take them every day.  Do not run out of insulin or other diabetes medicines that you take. Plan ahead so you always have these available.  If you use insulin, adjust your dosage based on how physically active you are and what foods you eat. Your health care provider will tell you how to adjust your dosage. Make healthy food choices  The things  that you eat and drink affect your blood glucose (and your insulin dose, if this applies). Making good choices helps to control your diabetes and prevent other health problems. A healthy meal plan includes eating lean proteins, complex carbohydrates, fresh fruits and vegetables, low-fat dairy products, and healthy fats. Make an appointment to see a diet and nutrition specialist (registered dietitian) to help you create an eating plan that is right for you. Make sure that you:  Follow instructions from your health care provider about eating or drinking restrictions.  Drink enough fluid to keep your urine pale yellow.  Eat healthy snacks between nutritious meals.  Keep a record of the carbohydrates that you eat. Do this by reading food labels and learning the standard serving sizes of foods.  Follow your sick day plan whenever you cannot eat or drink as usual. Make this plan in advance with your health care provider.  Stay active  Do 30  or more minutes of physical activity a day, or as much physical activity as your health care provider recommends during your pregnancy. ? Doing 10 minutes of exercise starting 30 minutes after each meal may help to control postprandial blood glucose levels.  If you start a new exercise or activity, work with your health care provider to adjust your insulin, medicines, or food intake as needed. Make healthy lifestyle choices  Do not drink alcohol.  Do not use any tobacco products, such as cigarettes, chewing tobacco, and e-cigarettes. If you need help quitting, ask your health care provider.  Learn to manage stress. If you need help with this, ask your health care provider. Care for your body  Keep your immunizations up to date.  Brush your teeth and gums two times a day, and floss one or more times a day. Visit your dentist one or more times every 6 months.  Maintain a healthy weight during your pregnancy. General instructions  Take over-the-counter and prescription medicines only as told by your health care provider.  Talk with your health care provider about your risk for high blood pressure during pregnancy (preeclampsia or eclampsia).  Share your diabetes management plan with people in your workplace, school, and household.  Check your urine for ketones during your pregnancy when you are ill and as told by your health care provider.  Carry a medical alert card or wear medical alert jewelry that says you have gestational diabetes.  Keep all follow-up visits during your pregnancy (prenatal) and after delivery (postnatal) as told by your health care provider. This is important. Get the care that you need after delivery  Have your blood glucose level checked 4-12 weeks after delivery. This is done with an oral glucose tolerance test (OGTT).  Get screened for diabetes at least every 3 years, or as often as told by your health care provider. Questions to ask your health care  provider  Do I need to meet with a diabetes educator?  Where can I find a support group for people with gestational diabetes? Where to find more information For more information about gestational diabetes, visit:  American Diabetes Association (ADA): www.diabetes.org  Centers for Disease Control and Prevention (CDC): http://www.wolf.info/ Summary  Check your blood glucose every day during your pregnancy. Do this as often as told by your health care provider.  If your health care provider prescribed insulin or diabetes medicines, take them every day as told.  Keep all follow-up visits during your pregnancy (prenatal) and after delivery (postnatal) as told by your health care  provider. This is important.  Have your blood glucose level checked 4-12 weeks after delivery. This information is not intended to replace advice given to you by your health care provider. Make sure you discuss any questions you have with your health care provider. Document Revised: 07/09/2017 Document Reviewed: 02/19/2015 Elsevier Patient Education  2020 [employer].

## 2019-10-10 LAB — HEMOGLOBIN A1C
Est. average glucose Bld gHb Est-mCnc: 160 mg/dL
Hgb A1c MFr Bld: 7.2 % — ABNORMAL HIGH (ref 4.8–5.6)

## 2019-10-15 ENCOUNTER — Telehealth: Payer: Self-pay

## 2019-10-15 ENCOUNTER — Ambulatory Visit (INDEPENDENT_AMBULATORY_CARE_PROVIDER_SITE_OTHER): Payer: No Typology Code available for payment source | Admitting: Certified Nurse Midwife

## 2019-10-15 ENCOUNTER — Other Ambulatory Visit: Payer: Self-pay

## 2019-10-15 ENCOUNTER — Other Ambulatory Visit: Payer: Self-pay | Admitting: Certified Nurse Midwife

## 2019-10-15 VITALS — BP 109/67 | HR 90 | Wt 176.1 lb

## 2019-10-15 DIAGNOSIS — Z3A34 34 weeks gestation of pregnancy: Secondary | ICD-10-CM | POA: Diagnosis not present

## 2019-10-15 LAB — POCT URINALYSIS DIPSTICK OB
Bilirubin, UA: NEGATIVE
Blood, UA: NEGATIVE
Glucose, UA: NEGATIVE
Ketones, UA: NEGATIVE
Leukocytes, UA: NEGATIVE
Nitrite, UA: NEGATIVE
POC,PROTEIN,UA: NEGATIVE
Spec Grav, UA: 1.01 (ref 1.010–1.025)
Urobilinogen, UA: 0.2 E.U./dL
pH, UA: 5 (ref 5.0–8.0)

## 2019-10-15 MED ORDER — GLYBURIDE 5 MG PO TABS: 7.5000 mg | ORAL_TABLET | Freq: Two times a day (BID) | ORAL | 4 refills | Status: DC

## 2019-10-15 NOTE — Telephone Encounter (Signed)
-----   Message from Annie Thompson, CNM sent at 10/15/2019  1:35 PM EDT ----- °Kathleen Barnes ,  ° °Please call Kathleen Barnes and let her know that I consulted DR. Cherry on her medications. She is to increase her glyburide to 7.5 mg ( 1 1/2 tablets) twice a day. Please tell her to complete a food diary so we can see what she is eating that maybe causing the spike in her blood sugar.  ° °Thanks,  °Annie  ° °

## 2019-10-15 NOTE — Progress Notes (Signed)
ROB doing well. Follow up since start of medications . Fastings 101-131 , 2 hr pp 117, [phone]. Pt state she is following the diabetic diet. Consulted Dr. Valentino Saxon. Glyburide incrased 7.5 mg BID. Ask pt to complete food diary when levels elevated. Follow up 1 wk for review of BS.   Doreene Burke, CNM

## 2019-10-15 NOTE — Telephone Encounter (Signed)
Unable to leave a message due to voicemail not set up. Mychart message sent to patient.

## 2019-10-15 NOTE — Telephone Encounter (Signed)
-----   Message from Doreene Burke, [place] sent at 10/15/2019  1:35 PM EDT ----- Jasmine December ,   Please call Jackelyn and let her know that I consulted DR. Cherry on her medications. She is to increase her glyburide to 7.5 mg ( 1 1/2 tablets) twice a day. Please tell her to complete a food diary so we can see what she is eating that maybe causing the spike in her blood sugar.   Thanks,  Pattricia Boss

## 2019-10-15 NOTE — Patient Instructions (Signed)
Gestational Diabetes Mellitus, Diagnosis Gestational diabetes (gestational diabetes mellitus) is a short-term (temporary) form of diabetes that can happen during pregnancy. It goes away after you give birth. It may be caused by one or both of these problems:  Your pancreas does not make enough of a hormone called insulin.  Your body does not respond in a normal way to insulin that it makes. Insulin lets sugars (glucose) go into cells in the body. This gives you energy. If you have diabetes, sugars cannot get into cells. This causes high blood sugar (hyperglycemia). If you get gestational diabetes, you are:  More likely to get it if you get pregnant again.  More likely to develop type 2 diabetes in the future. If gestational diabetes is treated, it may not hurt you or your baby. Your doctor will set treatment goals for you. In general, you should have these blood sugar levels:  After not eating for a long time (fasting): 95 mg/dL (5.3 mmol/L).  After meals (postprandial): ? One hour after a meal: at or below 140 mg/dL (7.8 mmol/L). ? Two hours after a meal: at or below 120 mg/dL (6.7 mmol/L).  A1c (hemoglobin A1c) level: 6-6.5%. Follow these instructions at home: Questions to ask your doctor   You may want to ask these questions: ? Do I need to meet with a diabetes educator? ? What equipment will I need to care for myself at home? ? What medicines do I need? When should I take them? ? How often do I need to check my blood sugar? ? What number can I call if I have questions? ? When is my next doctor's visit? General instructions  Take over-the-counter and prescription medicines only as told by your doctor.  Stay at a healthy weight during pregnancy.  Keep all follow-up visits as told by your doctor. This is important. Contact a doctor if:  Your blood sugar is at or above 240 mg/dL (13.3 mmol/L).  Your blood sugar is at or above 200 mg/dL (11.1 mmol/L) and you have ketones in  your pee (urine).  You have been sick or have had a fever for 2 days or more and you are not getting better.  You have any of these problems for more than 6 hours: ? You cannot eat or drink. ? You feel sick to your stomach (nauseous). ? You throw up (vomit). ? You have watery poop (diarrhea). Get help right away if:  Your blood sugar is lower than 54 mg/dL (3 mmol/L).  You get confused.  You have trouble: ? Thinking clearly. ? Breathing.  Your baby moves less than normal.  You have any of these: ? Moderate or large ketone levels in your pee. ? Blood coming from your vagina. ? Unusual fluid coming from your vagina. ? Early contractions. These may feel like tightness in your belly. Summary  Gestational diabetes is a short-term form of diabetes. It can happen while you are pregnant. It goes away after you give birth.  If gestational diabetes is treated, it may not hurt you or your baby. Your doctor will set treatment goals for you.  Keep all follow-up visits as told by your doctor. This is important. This information is not intended to replace advice given to you by your health care provider. Make sure you discuss any questions you have with your health care provider. Document Revised: 02/22/2017 Document Reviewed: 02/19/2015 Elsevier Patient Education  2020 Else[employer] Inc.  

## 2019-10-15 NOTE — Telephone Encounter (Signed)
Patient returned my call- gave her Kathleen Barnes instructions. Patient expressed understanding.

## 2019-10-27 ENCOUNTER — Ambulatory Visit (INDEPENDENT_AMBULATORY_CARE_PROVIDER_SITE_OTHER): Payer: No Typology Code available for payment source | Admitting: Certified Nurse Midwife

## 2019-10-27 ENCOUNTER — Other Ambulatory Visit: Payer: Self-pay

## 2019-10-27 VITALS — BP 110/74 | HR 88 | Wt 184.4 lb

## 2019-10-27 DIAGNOSIS — Z3A36 36 weeks gestation of pregnancy: Secondary | ICD-10-CM | POA: Diagnosis not present

## 2019-10-27 DIAGNOSIS — Z8632 Personal history of gestational diabetes: Secondary | ICD-10-CM

## 2019-10-27 DIAGNOSIS — O09899 Supervision of other high risk pregnancies, unspecified trimester: Secondary | ICD-10-CM

## 2019-10-27 LAB — POCT URINALYSIS DIPSTICK OB
Bilirubin, UA: NEGATIVE
Blood, UA: NEGATIVE
Glucose, UA: NEGATIVE
Ketones, UA: NEGATIVE
Leukocytes, UA: NEGATIVE
Nitrite, UA: NEGATIVE
POC,PROTEIN,UA: NEGATIVE
Spec Grav, UA: 1.01 (ref 1.010–1.025)
Urobilinogen, UA: 0.2 E.U./dL
pH, UA: 5 (ref 5.0–8.0)

## 2019-10-27 LAB — OB RESULTS CONSOLE GC/CHLAMYDIA: Gonorrhea: NEGATIVE

## 2019-10-27 NOTE — Progress Notes (Signed)
ROB and NST for GDM on meds. GBS and cultures collected today. SVE per pt request 2/70/-2. BS log reviewed. fasting's 83-106, 2 hr pp  elevated one normal range 111 ( 136-272). On initial discussion pt state she has been following diet. While checking on on her during NST pt state she had [employer] and [employer] and admits to having difficulty following diet due to " staying in different places" . Dr. Logan Bores consulted , pt to follow up this week with him and have u/s for growth/AFI. She verbalizes and agrees to plan.   NST: Reactive Baseline:150 Moderate variability Accelerations present, decelerations absent Toco: occasional ctx

## 2019-10-29 ENCOUNTER — Ambulatory Visit (INDEPENDENT_AMBULATORY_CARE_PROVIDER_SITE_OTHER): Payer: No Typology Code available for payment source

## 2019-10-29 ENCOUNTER — Ambulatory Visit (INDEPENDENT_AMBULATORY_CARE_PROVIDER_SITE_OTHER): Payer: No Typology Code available for payment source | Admitting: Obstetrics and Gynecology

## 2019-10-29 ENCOUNTER — Encounter: Payer: Self-pay | Admitting: Obstetrics and Gynecology

## 2019-10-29 ENCOUNTER — Other Ambulatory Visit: Payer: Self-pay

## 2019-10-29 VITALS — BP 118/80 | HR 99 | Wt 185.1 lb

## 2019-10-29 DIAGNOSIS — Z8632 Personal history of gestational diabetes: Secondary | ICD-10-CM

## 2019-10-29 DIAGNOSIS — Z3A36 36 weeks gestation of pregnancy: Secondary | ICD-10-CM

## 2019-10-29 DIAGNOSIS — O09899 Supervision of other high risk pregnancies, unspecified trimester: Secondary | ICD-10-CM | POA: Diagnosis not present

## 2019-10-29 DIAGNOSIS — O24414 Gestational diabetes mellitus in pregnancy, insulin controlled: Secondary | ICD-10-CM

## 2019-10-29 LAB — GC/CHLAMYDIA PROBE AMP
Chlamydia trachomatis, NAA: NEGATIVE
Neisseria Gonorrhoeae by PCR: NEGATIVE

## 2019-10-29 LAB — STREP GP B NAA: Strep Gp B NAA: NEGATIVE

## 2019-10-29 MED ORDER — INSULIN REGULAR HUMAN 100 UNIT/ML IJ SOLN: 13.0000 [IU] | Freq: Every day | INTRAMUSCULAR | 1 refills | Status: DC

## 2019-10-29 MED ORDER — INSULIN NPH (HUMAN) (ISOPHANE) 100 UNIT/ML ~~LOC~~ SUSP: 13.0000 [IU] | Freq: Every day | SUBCUTANEOUS | 1 refills | Status: DC

## 2019-10-29 MED ORDER — INSULIN REGULAR HUMAN 100 UNIT/ML IJ SOLN: 17.0000 [IU] | Freq: Every day | INTRAMUSCULAR | 1 refills | Status: DC

## 2019-10-29 MED ORDER — INSULIN NPH (HUMAN) (ISOPHANE) 100 UNIT/ML ~~LOC~~ SUSP: 34.0000 [IU] | Freq: Every day | SUBCUTANEOUS | 1 refills | Status: DC

## 2019-10-29 NOTE — Progress Notes (Signed)
ROB: Here as a transfer from midwife side for insulin requiring diabetes.  We have discussed her sugars and her need for insulin.  Urgent referral to dietary for instruction regarding insulin placed.  NPH 34/regular 17 a.m. -NPH 13/regular 13 p.m. Weekly NSTs.  Discussed early delivery.  Ultrasound today shows 51 percentile growth

## 2019-10-30 ENCOUNTER — Telehealth: Payer: Self-pay | Admitting: Surgical

## 2019-10-30 ENCOUNTER — Encounter: Payer: Self-pay | Admitting: *Deleted

## 2019-10-30 ENCOUNTER — Encounter: Payer: No Typology Code available for payment source | Attending: Obstetrics and Gynecology | Admitting: *Deleted

## 2019-10-30 VITALS — BP 120/84 | Ht 60.0 in | Wt 185.5 lb

## 2019-10-30 DIAGNOSIS — Z3A36 36 weeks gestation of pregnancy: Secondary | ICD-10-CM | POA: Diagnosis not present

## 2019-10-30 DIAGNOSIS — O24414 Gestational diabetes mellitus in pregnancy, insulin controlled: Secondary | ICD-10-CM | POA: Insufficient documentation

## 2019-10-30 MED ORDER — SYRINGE (DISPOSABLE) 1 ML MISC: 5 refills | Status: DC

## 2019-10-30 MED ORDER — "NEEDLE (DISP) 32G X 5/16"" MISC": 5 refills | Status: DC

## 2019-10-30 NOTE — Telephone Encounter (Signed)
Spoke with patient on 10/29/2019 about her insurance. The pharmacy states that the patient has two insurance and they could not fill her insulin. I tried to do a PA and it would not go through. Patient stated that she only has medicaid. After speaking with rose and Angie patient needs to call her [employer] line and see what is going on. If she does indeed have two medicaid open she will need to call her case worker. I gave patient this information and the number to [facility]. Patient verbalized understanding. I told the patient to call me back once she has spoke with them so I could reach out to pharmacy. Patient has not called back to let me know if she got this fixed. I will call her tomorrow.

## 2019-10-30 NOTE — Addendum Note (Signed)
Addended by: Dorian Pod on: 10/30/2019 03:52 PM   Modules accepted: Orders

## 2019-10-30 NOTE — Patient Instructions (Addendum)
  Follow Gestational Meal Planning Guidelines Check blood sugars 4 x day - before breakfast and 2 hrs after every meal and record  Bring blood sugar log to MD appointments  Walk 20-30 minutes at least 5 x week if permitted by MD  Carry fast acting glucose and a snack at all times Rotate injection sites Give morning insulin 30 minutes before breakfast and evening insulin 30 minutes before supper  Morning insulin Humulin/Novolin    N  (cloudy)     34      units Humulin/Novolin    R  (clear)      17         units                                            51         units  Evening insulin Humulin/Novolin    N (cloudy)      13         units Humulin/Novolin    R (clear)       13         units                     26         units

## 2019-10-30 NOTE — Progress Notes (Signed)
Diabetes Self-Management Education  Visit Type: Follow-up  Appt. Start Time: 1320 Appt. End Time: 1415  10/30/2019  [name], identified by name and date of birth, is a 19 y.o. female with a diagnosis of Diabetes:  Insulin Controlled Gestational Diabetes.   ASSESSMENT  Blood pressure 120/84, height 5' (1.524 m), weight 185 lb 8 oz (84.1 kg), last menstrual period 02/18/2019, estimated date of delivery 11/25/2019 Body mass index is 36.23 kg/m.   Diabetes Self-Management Education - 10/30/19 1457      Visit Information   Visit Type Follow-up      Complications   How often do you check your blood sugar? 3-4 times/day    Fasting Blood glucose range (mg/dL) 02-542   Pt reports last 2 FBG's were 80's mg/dL.     Dietary Intake   Breakfast 3 meals and 0-1 snacks/day      Exercise   Exercise Type Light (walking / raking leaves)    How many days per week to you exercise? 7    How many minutes per day do you exercise? 30    Total minutes per week of exercise 210      Patient Education   Previous Diabetes Education Yes (please comment)   Gestational diabetes education here   Nutrition management  Reviewed blood glucose goals for pre and post meals and how to evaluate the patients' food intake on their blood glucose level.;Meal timing in regards to the patients' current diabetes medication.    Physical activity and exercise  Role of exercise on diabetes management, blood pressure control and cardiac health.    Medications Taught/reviewed insulin injection, site rotation, insulin storage and needle disposal.;Reviewed patients medication for diabetes, action, purpose, timing of dose and side effects. She injected 5 units NS subcutaneously to left abdomen without difficulty.    Monitoring Purpose and frequency of SMBG.;Identified appropriate SMBG and/or A1C goals.    Acute complications Taught treatment of hypoglycemia - the 15 rule.    Psychosocial adjustment Identified and addressed  patients feelings and concerns about diabetes   Pt was crying at first of appointment and reported hormones from pregnancy.   Preconception care Reviewed with patient blood glucose goals with pregnancy      Individualized Goals (developed by patient)   Nutrition Follow meal plan discussed    Physical Activity Exercise 3-5 times per week;30 minutes per day    Medications take my medication as prescribed    Monitoring  test my blood glucose as discussed    Reducing Risk examine blood glucose patterns;treat hypoglycemia with 15 grams of carbs if blood glucose less than 70mg /dL      Outcomes   Expected Outcomes Demonstrated interest in learning. Expect positive outcomes    Program Status Completed      Subsequent Visit   Since your last visit have you continued or begun to take your medications as prescribed? Yes   sometimes she forgets her Glyburide in the evening   Since your last visit have you had your blood pressure checked? Yes    Is your most recent blood pressure lower, unchanged, or higher since your last visit? Unchanged    Since your last visit have you experienced any weight changes? No change    Since your last visit, are you checking your blood glucose at least once a day? Yes           Individualized Plan for Diabetes Self-Management Training:   Learning Objective:  Patient will have a greater  understanding of diabetes self-management. Patient education plan is to attend individual and/or group sessions per assessed needs and concerns.   Plan:   Patient Instructions     Follow Gestational Meal Planning Guidelines Check blood sugars 4 x day - before breakfast and 2 hrs after every meal and record  Bring blood sugar log to MD appointments  Walk 20-30 minutes at least 5 x week if permitted by MD  Carry fast acting glucose and a snack at all times Rotate injection sites Give morning insulin 30 minutes before breakfast and evening insulin 30 minutes before  supper  Morning insulin Humulin/Novolin    N  (cloudy)     34      units Humulin/Novolin    R  (clear)      17         units                                            51         units  Evening insulin Humulin/Novolin    N (cloudy)      13         units Humulin/Novolin    R (clear)       13         units                     26         units  Expected Outcomes:  Demonstrated interest in learning. Expect positive outcomes  Education material provided:  Injection Guide (BD) Glucose tablets Symptoms, causes and treatments of Hypoglycemia  If problems or questions, patient to contact team via:  Sharion Settler, RN, CCM, CDCES [phone]  Future DSME appointment: PRN

## 2019-10-31 ENCOUNTER — Telehealth: Payer: Self-pay | Admitting: *Deleted

## 2019-10-31 ENCOUNTER — Encounter: Payer: No Typology Code available for payment source | Admitting: Certified Nurse Midwife

## 2019-10-31 NOTE — Telephone Encounter (Signed)
Phone call to patient x 2 to follow up on insulin administration. Not able to leave message as her phone does not have voice mail set up.

## 2019-11-03 ENCOUNTER — Telehealth: Payer: Self-pay | Admitting: *Deleted

## 2019-11-03 NOTE — Telephone Encounter (Signed)
Phone call to follow up with insulin administration. She reports doing well with injections and mixing R and N insulin. She reports no low blood sugars but can feel when sugar is starting to drop. Instructed her to not skip meals and keep fast acting glucose and snack available at all times. She reports fasting blood sugars now 80's and post meal readings less than 120. Instructed her to call back for any questions.

## 2019-11-04 ENCOUNTER — Other Ambulatory Visit: Payer: Self-pay

## 2019-11-04 ENCOUNTER — Other Ambulatory Visit: Payer: No Typology Code available for payment source

## 2019-11-04 ENCOUNTER — Encounter: Payer: No Typology Code available for payment source | Admitting: Obstetrics and Gynecology

## 2019-11-04 ENCOUNTER — Inpatient Hospital Stay
Admission: EM | Admit: 2019-11-04 | Discharge: 2019-11-09 | DRG: 768 | Disposition: A | Discharge status: Home / Self Care | Payer: No Typology Code available for payment source | Attending: Obstetrics and Gynecology | Admitting: Obstetrics and Gynecology

## 2019-11-04 ENCOUNTER — Encounter: Payer: Self-pay | Admitting: Obstetrics and Gynecology

## 2019-11-04 DIAGNOSIS — O9912 Other diseases of the blood and blood-forming organs and certain disorders involving the immune mechanism complicating childbirth: Secondary | ICD-10-CM | POA: Diagnosis present

## 2019-11-04 DIAGNOSIS — O43123 Velamentous insertion of umbilical cord, third trimester: Secondary | ICD-10-CM | POA: Diagnosis present

## 2019-11-04 DIAGNOSIS — Z789 Other specified health status: Secondary | ICD-10-CM | POA: Diagnosis not present

## 2019-11-04 DIAGNOSIS — O24424 Gestational diabetes mellitus in childbirth, insulin controlled: Secondary | ICD-10-CM | POA: Diagnosis present

## 2019-11-04 DIAGNOSIS — O1205 Gestational edema, complicating the puerperium: Secondary | ICD-10-CM | POA: Diagnosis present

## 2019-11-04 DIAGNOSIS — Z3A37 37 weeks gestation of pregnancy: Secondary | ICD-10-CM

## 2019-11-04 DIAGNOSIS — E538 Deficiency of other specified B group vitamins: Secondary | ICD-10-CM

## 2019-11-04 DIAGNOSIS — D62 Acute posthemorrhagic anemia: Secondary | ICD-10-CM | POA: Diagnosis not present

## 2019-11-04 DIAGNOSIS — Z23 Encounter for immunization: Secondary | ICD-10-CM

## 2019-11-04 DIAGNOSIS — D696 Thrombocytopenia, unspecified: Secondary | ICD-10-CM | POA: Diagnosis present

## 2019-11-04 DIAGNOSIS — N99821 Postprocedural hemorrhage and hematoma of a genitourinary system organ or structure following other procedure: Secondary | ICD-10-CM | POA: Diagnosis not present

## 2019-11-04 DIAGNOSIS — D72829 Elevated white blood cell count, unspecified: Secondary | ICD-10-CM | POA: Diagnosis present

## 2019-11-04 DIAGNOSIS — O24415 Gestational diabetes mellitus in pregnancy, controlled by oral hypoglycemic drugs: Secondary | ICD-10-CM | POA: Diagnosis not present

## 2019-11-04 DIAGNOSIS — Z20822 Contact with and (suspected) exposure to covid-19: Secondary | ICD-10-CM | POA: Diagnosis present

## 2019-11-04 DIAGNOSIS — R7309 Other abnormal glucose: Secondary | ICD-10-CM | POA: Diagnosis not present

## 2019-11-04 DIAGNOSIS — O09899 Supervision of other high risk pregnancies, unspecified trimester: Secondary | ICD-10-CM

## 2019-11-04 DIAGNOSIS — O9081 Anemia of the puerperium: Secondary | ICD-10-CM | POA: Diagnosis not present

## 2019-11-04 DIAGNOSIS — R7989 Other specified abnormal findings of blood chemistry: Secondary | ICD-10-CM

## 2019-11-04 DIAGNOSIS — O26893 Other specified pregnancy related conditions, third trimester: Secondary | ICD-10-CM | POA: Diagnosis present

## 2019-11-04 DIAGNOSIS — Z2839 Other underimmunization status: Secondary | ICD-10-CM

## 2019-11-04 LAB — GLUCOSE, CAPILLARY: Glucose-Capillary: 88 mg/dL (ref 70–99)

## 2019-11-04 LAB — HEPATIC FUNCTION PANEL
ALT: 11 U/L (ref 0–44)
AST: 22 U/L (ref 15–41)
Albumin: 2.6 g/dL — ABNORMAL LOW (ref 3.5–5.0)
Alkaline Phosphatase: 105 U/L (ref 38–126)
Bilirubin, Direct: <0.1 mg/dL (ref 0.0–0.2)
Total Bilirubin: 0.4 mg/dL (ref 0.3–1.2)
Total Protein: 6.4 g/dL — ABNORMAL LOW (ref 6.5–8.1)

## 2019-11-04 LAB — CBC
HCT: 31.1 % — ABNORMAL LOW (ref 36.0–46.0)
Hemoglobin: 10.3 g/dL — ABNORMAL LOW (ref 12.0–15.0)
MCH: 27 pg (ref 26.0–34.0)
MCHC: 33.1 g/dL (ref 30.0–36.0)
MCV: 81.6 fL (ref 80.0–100.0)
Platelets: 164 10*3/uL (ref 150–400)
RBC: 3.81 MIL/uL — ABNORMAL LOW (ref 3.87–5.11)
RDW: 15.7 % — ABNORMAL HIGH (ref 11.5–15.5)
WBC: 6.1 10*3/uL (ref 4.0–10.5)
nRBC: 0 % (ref 0.0–0.2)

## 2019-11-04 LAB — PROTEIN / CREATININE RATIO, URINE
Creatinine, Urine: 74 mg/dL
Protein Creatinine Ratio: 0.19 mg/mg{Cre} — ABNORMAL HIGH (ref 0.00–0.15)
Total Protein, Urine: 14 mg/dL

## 2019-11-04 LAB — RESPIRATORY PANEL BY RT PCR (FLU A&B, COVID)
Influenza A by PCR: NEGATIVE
Influenza B by PCR: NEGATIVE
SARS Coronavirus 2 by RT PCR: NEGATIVE

## 2019-11-04 LAB — ABO/RH: ABO/RH(D): O POS

## 2019-11-04 LAB — HEMOGLOBIN AND HEMATOCRIT, BLOOD
HCT: 21 % — ABNORMAL LOW (ref 36.0–46.0)
Hemoglobin: 7.3 g/dL — ABNORMAL LOW (ref 12.0–15.0)

## 2019-11-04 MED ORDER — IBUPROFEN 600 MG PO TABS
600.0000 mg | ORAL_TABLET | Freq: Four times a day (QID) | ORAL | Status: DC
  Administered 2019-11-08 – 2019-11-09 (×5): 600 mg via ORAL
  Filled 2019-11-04 (×5): qty 1

## 2019-11-04 MED ORDER — TETANUS-DIPHTH-ACELL PERTUSSIS 5-2.5-18.5 LF-MCG/0.5 IM SUSP
0.5000 mL | Freq: Once | INTRAMUSCULAR | Status: DC
  Filled 2019-11-04: qty 0.5

## 2019-11-04 MED ORDER — SIMETHICONE 80 MG PO CHEW: 80.0000 mg | CHEWABLE_TABLET | ORAL | Status: DC | PRN

## 2019-11-04 MED ORDER — ONDANSETRON HCL 4 MG/2ML IJ SOLN: 4.0000 mg | Freq: Four times a day (QID) | INTRAMUSCULAR | Status: DC | PRN

## 2019-11-04 MED ORDER — PRENATAL MULTIVITAMIN CH
1.0000 | ORAL_TABLET | Freq: Every day | ORAL | Status: DC
  Administered 2019-11-09: 1 via ORAL
  Filled 2019-11-04 (×2): qty 1

## 2019-11-04 MED ORDER — OXYTOCIN-SODIUM CHLORIDE 30-0.9 UT/500ML-% IV SOLN
2.5000 [IU]/h | INTRAVENOUS | Status: DC | PRN
  Filled 2019-11-04: qty 500

## 2019-11-04 MED ORDER — BENZOCAINE-MENTHOL 20-0.5 % EX AERO
INHALATION_SPRAY | CUTANEOUS | Status: AC
  Filled 2019-11-04: qty 56

## 2019-11-04 MED ORDER — LACTATED RINGERS IV SOLN
500.0000 mL | INTRAVENOUS | Status: DC | PRN
  Administered 2019-11-04: 1000 mL via INTRAVENOUS

## 2019-11-04 MED ORDER — LABETALOL HCL 5 MG/ML IV SOLN: 20.0000 mg | INTRAVENOUS | Status: DC | PRN

## 2019-11-04 MED ORDER — OXYTOCIN-SODIUM CHLORIDE 30-0.9 UT/500ML-% IV SOLN
1.0000 m[IU]/min | INTRAVENOUS | Status: DC
  Administered 2019-11-04: 2 m[IU]/min via INTRAVENOUS

## 2019-11-04 MED ORDER — BUTORPHANOL TARTRATE 1 MG/ML IJ SOLN
INTRAMUSCULAR | Status: AC
  Filled 2019-11-04: qty 2

## 2019-11-04 MED ORDER — SODIUM CHLORIDE 0.9 % IV SOLN: 5.0000 10*6.[IU] | Freq: Once | INTRAVENOUS | Status: DC

## 2019-11-04 MED ORDER — SOD CITRATE-CITRIC ACID 500-334 MG/5ML PO SOLN: 30.0000 mL | ORAL | Status: DC | PRN

## 2019-11-04 MED ORDER — LACTATED RINGERS IV SOLN
INTRAVENOUS | Status: DC
  Administered 2019-11-04: 1000 mL via INTRAVENOUS

## 2019-11-04 MED ORDER — LABETALOL HCL 5 MG/ML IV SOLN: 80.0000 mg | INTRAVENOUS | Status: DC | PRN

## 2019-11-04 MED ORDER — OXYTOCIN-SODIUM CHLORIDE 30-0.9 UT/500ML-% IV SOLN
INTRAVENOUS | Status: AC
  Filled 2019-11-04: qty 1000

## 2019-11-04 MED ORDER — ACETAMINOPHEN 325 MG PO TABS
650.0000 mg | ORAL_TABLET | ORAL | Status: DC | PRN
  Administered 2019-11-06 – 2019-11-07 (×3): 650 mg via ORAL
  Filled 2019-11-04 (×2): qty 2

## 2019-11-04 MED ORDER — AMMONIA AROMATIC IN INHA
RESPIRATORY_TRACT | Status: AC
  Filled 2019-11-04: qty 10

## 2019-11-04 MED ORDER — ACETAMINOPHEN 325 MG PO TABS: 650.0000 mg | ORAL_TABLET | ORAL | Status: DC | PRN

## 2019-11-04 MED ORDER — LIDOCAINE HCL (PF) 1 % IJ SOLN: 30.0000 mL | INTRAMUSCULAR | Status: DC | PRN

## 2019-11-04 MED ORDER — OXYTOCIN 10 UNIT/ML IJ SOLN
INTRAMUSCULAR | Status: AC
  Filled 2019-11-04: qty 2

## 2019-11-04 MED ORDER — OXYTOCIN BOLUS FROM INFUSION: 333.0000 mL | Freq: Once | INTRAVENOUS | Status: DC

## 2019-11-04 MED ORDER — LIDOCAINE HCL (PF) 1 % IJ SOLN
INTRAMUSCULAR | Status: AC
  Filled 2019-11-04: qty 30

## 2019-11-04 MED ORDER — OXYTOCIN-SODIUM CHLORIDE 30-0.9 UT/500ML-% IV SOLN
2.5000 [IU]/h | INTRAVENOUS | Status: DC
  Administered 2019-11-04: 2.5 [IU]/h via INTRAVENOUS

## 2019-11-04 MED ORDER — DOCUSATE SODIUM 100 MG PO CAPS
100.0000 mg | ORAL_CAPSULE | Freq: Two times a day (BID) | ORAL | Status: DC
  Administered 2019-11-05 – 2019-11-06 (×2): 100 mg via ORAL
  Filled 2019-11-04 (×5): qty 1

## 2019-11-04 MED ORDER — ZOLPIDEM TARTRATE 5 MG PO TABS
5.0000 mg | ORAL_TABLET | Freq: Every evening | ORAL | Status: DC | PRN
  Filled 2019-11-04: qty 1

## 2019-11-04 MED ORDER — PENICILLIN G POT IN DEXTROSE 60000 UNIT/ML IV SOLN: 3.0000 10*6.[IU] | INTRAVENOUS | Status: DC

## 2019-11-04 MED ORDER — MISOPROSTOL 200 MCG PO TABS
ORAL_TABLET | ORAL | Status: AC
  Filled 2019-11-04: qty 4

## 2019-11-04 MED ORDER — HYDRALAZINE HCL 20 MG/ML IJ SOLN: 10.0000 mg | INTRAMUSCULAR | Status: DC | PRN

## 2019-11-04 MED ORDER — MISOPROSTOL 200 MCG PO TABS
ORAL_TABLET | ORAL | Status: AC
  Administered 2019-11-04: 600 ug
  Filled 2019-11-04: qty 3

## 2019-11-04 MED ORDER — DIPHENHYDRAMINE HCL 25 MG PO CAPS: 25.0000 mg | ORAL_CAPSULE | Freq: Four times a day (QID) | ORAL | Status: DC | PRN

## 2019-11-04 MED ORDER — BUTORPHANOL TARTRATE 1 MG/ML IJ SOLN
1.0000 mg | INTRAMUSCULAR | Status: DC | PRN
  Administered 2019-11-04: 1 mg via INTRAVENOUS
  Filled 2019-11-04: qty 1

## 2019-11-04 MED ORDER — BENZOCAINE-MENTHOL 20-0.5 % EX AERO: 1.0000 "application " | INHALATION_SPRAY | CUTANEOUS | Status: DC | PRN

## 2019-11-04 MED ORDER — OXYCODONE-ACETAMINOPHEN 5-325 MG PO TABS: 1.0000 | ORAL_TABLET | ORAL | Status: DC | PRN

## 2019-11-04 MED ORDER — LABETALOL HCL 5 MG/ML IV SOLN: 40.0000 mg | INTRAVENOUS | Status: DC | PRN

## 2019-11-04 NOTE — H&P (Signed)
History and Physical   HPI  Kathleen Barnes is a 19 y.o. G1P0 at [redacted]w[redacted]d Estimated Date of Delivery: 11/25/19 who is being admitted for labor management and possible increased vaginal discharge ? ROM.   Of significant note patient was taking oral hypoglycemics for the pregnancy but her sugars remain significantly elevated.  Last week she was prescribed insulin for management but as she continued to monitor her daily blood sugars she noted they were within normal range decided not to begin the insulin.   OB History  OB History  Gravida Para Term Preterm AB Living  1 0 0 0 0 0  SAB TAB Ectopic Multiple Live Births  0 0 0 0 0    # Outcome Date GA Lbr Len/2nd Weight Sex Delivery Anes PTL Lv  1 Current             PROBLEM LIST  Pregnancy complications or risks: Patient Active Problem List   Diagnosis Date Noted  . Normal labor 11/04/2019  . Abnormal glucose tolerance test (GTT) 09/12/2019  . Marginal insertion of umbilical cord affecting management of mother 07/17/2019  . Echogenic intracardiac focus of fetus on prenatal ultrasound 07/17/2019  . High risk teen pregnancy, antepartum 05/16/2019  . Maternal varicella, non-immune 05/16/2019  . History of chlamydia infection 10/16/2017    Prenatal labs and studies: ABO, Rh: --/--/O POS Performed at [facility], [address] [place], [place] [address]  [phone]/05 1020) Antibody: NEG (10/05 0932) Rubella: 1.48 (03/25 1015) RPR: Non Reactive (08/12 0923)  HBsAg: Negative (03/25 1015)  HIV: Non Reactive (03/25 1015)  LPF:XTKWIOXB/-- (09/27 1123)   Past Medical History:  Diagnosis Date  . Gestational diabetes   . History of chlamydia infection 10/16/2017     Past Surgical History:  Procedure Laterality Date  . no surgical history       Medications    Current Discharge Medication List    CONTINUE these medications which have NOT CHANGED   Details  Blood Glucose Monitoring Suppl (ACCU-CHEK NANO SMARTVIEW)  w/Device KIT 1 kit by Subdermal route as directed. Check blood sugars for fasting, and two hours after breakfast, lunch and dinner (4 checks daily) Qty: 1 kit, Refills: 0    glucose blood (ACCU-CHEK SMARTVIEW) test strip Use as instructed to check blood sugars Qty: 100 each, Refills: 12    glyBURIDE (DIABETA) 5 MG tablet Take 1.5 tablets (7.5 mg total) by mouth 2 (two) times daily with a meal. Qty: 60 tablet, Refills: 4    Prenatal Vit-Fe Fumarate-FA (MULTIVITAMIN-PRENATAL) 27-0.8 MG TABS tablet Take 1 tablet by mouth daily at 12 noon.    !! insulin NPH Human (NOVOLIN N) 100 UNIT/ML injection Inject 0.34 mLs (34 Units total) into the skin daily before breakfast. Qty: 10 mL, Refills: 1   Associated Diagnoses: [redacted] weeks gestation of pregnancy; Insulin controlled gestational diabetes mellitus (GDM) in third trimester    !! insulin NPH Human (NOVOLIN N) 100 UNIT/ML injection Inject 0.13 mLs (13 Units total) into the skin at bedtime. Qty: 10 mL, Refills: 1   Associated Diagnoses: [redacted] weeks gestation of pregnancy; Insulin controlled gestational diabetes mellitus (GDM) in third trimester    !! insulin regular (HUMULIN R) 100 units/mL injection Inject 0.17 mLs (17 Units total) into the skin daily before breakfast. Qty: 10 mL, Refills: 1   Associated Diagnoses: [redacted] weeks gestation of pregnancy; Insulin controlled gestational diabetes mellitus (GDM) in third trimester    !! insulin regular (HUMULIN R) 100  units/mL injection Inject 0.13 mLs (13 Units total) into the skin daily before supper. Qty: 10 mL, Refills: 1   Associated Diagnoses: [redacted] weeks gestation of pregnancy; Insulin controlled gestational diabetes mellitus (GDM) in third trimester    Needle, Disp, 32G X 5/16" MISC Please dispense needle for insulin use. Qty: 100 each, Refills: 5    Syringe, Disposable, 1 ML MISC Please dispense syringe to use for insulin Qty: 100 each, Refills: 5     !! - Potential duplicate medications found. Please  discuss with provider.       Allergies  Patient has no allergy information on record.  Review of Systems  Pertinent items noted in HPI and remainder of comprehensive ROS otherwise negative.  Physical Exam  BP (!) 143/91   Pulse 82   Temp 98.3 F (36.8 C) (Oral)   Resp 16   Ht 5' (1.524 m)   Wt 85 kg   LMP 02/18/2019   BMI 36.60 kg/m   Lungs:  CTA B Cardio: RRR without M/R/G Abd: Soft, gravid, NT Presentation: cephalic EXT: No C/C/ 1+ Edema DTRs: 2+ B CERVIX: Dilation: 7 Effacement (%): 90 Station: 0 Exam by:: [facility]  See Prenatal records for more detailed PE.     FHR:  Variability: Good {> 6 bpm)  Toco: Uterine Contractions: Q 2-5 min  Test Results  Results for orders placed or performed during the hospital encounter of 11/04/19 (from the past 24 hour(s))  Glucose, capillary     Status: None   Collection Time: 11/04/19  9:19 AM  Result Value Ref Range   Glucose-Capillary 88 70 - 99 mg/dL  Respiratory Panel by RT PCR (Flu A&B, Covid) - Nasopharyngeal Swab     Status: None   Collection Time: 11/04/19  9:19 AM   Specimen: Nasopharyngeal Swab  Result Value Ref Range   SARS Coronavirus 2 by RT PCR NEGATIVE NEGATIVE   Influenza A by PCR NEGATIVE NEGATIVE   Influenza B by PCR NEGATIVE NEGATIVE  CBC     Status: Abnormal   Collection Time: 11/04/19  9:32 AM  Result Value Ref Range   WBC 6.1 4.0 - 10.5 K/uL   RBC 3.81 (L) 3.87 - 5.11 MIL/uL   Hemoglobin 10.3 (L) 12.0 - 15.0 g/dL   HCT 31.1 (L) 36 - 46 %   MCV 81.6 80.0 - 100.0 fL   MCH 27.0 26.0 - 34.0 pg   MCHC 33.1 30.0 - 36.0 g/dL   RDW 15.7 (H) 11.5 - 15.5 %   Platelets 164 150 - 400 K/uL   nRBC 0.0 0.0 - 0.2 %  Type and screen [facility]     Status: None   Collection Time: 11/04/19  9:32 AM  Result Value Ref Range   ABO/RH(D) O POS    Antibody Screen NEG    Sample Expiration      11/07/2019,2359 Performed at [facility], [address] [place], [place]  [address]   Hepatic function panel     Status: Abnormal   Collection Time: 11/04/19  9:32 AM  Result Value Ref Range   Total Protein 6.4 (L) 6.5 - 8.1 g/dL   Albumin 2.6 (L) 3.5 - 5.0 g/dL   AST 22 15 - 41 U/L   ALT 11 0 - 44 U/L   Alkaline Phosphatase 105 38 - 126 U/L   Total Bilirubin 0.4 0.3 - 1.2 mg/dL   Bilirubin, Direct <0.1 0.0 - 0.2 mg/dL   Indirect Bilirubin NOT CALCULATED  0.3 - 0.9 mg/dL  ABO/Rh     Status: None   Collection Time: 11/04/19 10:20 AM  Result Value Ref Range   ABO/RH(D)      O POS Performed at [facility] [address] [place], [place] [address]   Protein / creatinine ratio, urine     Status: Abnormal   Collection Time: 11/04/19 11:47 AM  Result Value Ref Range   Creatinine, Urine 74 mg/dL   Total Protein, Urine 14 mg/dL   Protein Creatinine Ratio 0.19 (H) 0.00 - 0.15 mg/mg[Cre]   Group B Strep negative  Assessment   G1P0 at [redacted]w[redacted]d Estimated Date of Delivery: 11/25/19  The fetus is reassuring.   Patient Active Problem List   Diagnosis Date Noted  . Normal labor 11/04/2019  . Abnormal glucose tolerance test (GTT) 09/12/2019  . Marginal insertion of umbilical cord affecting management of mother 07/17/2019  . Echogenic intracardiac focus of fetus on prenatal ultrasound 07/17/2019  . High risk teen pregnancy, antepartum 05/16/2019  . Maternal varicella, non-immune 05/16/2019  . History of chlamydia infection 10/16/2017    Plan  1. Admit to L&D :   2. EFM: -- Category 1 3. Stadol or Epidural if desired.   4. Admission labs  5.  Expect vaginal birth.  Finis Bud, M.D. 11/04/2019 12:19 PM

## 2019-11-04 NOTE — Progress Notes (Signed)
Patient ID: Kathleen Barnes, female   DOB: 01/20/2001, 19 y.o.   MRN: [id number]   Called emergently to CD. When I returned, pt was noted to have increased vaginal bleeding. Fundal massage resulted in large clots. Pt given rectal misoprostol. Bladder again emptied of @250ml  urine.

## 2019-11-05 ENCOUNTER — Inpatient Hospital Stay: Payer: No Typology Code available for payment source | Admitting: Anesthesiology

## 2019-11-05 ENCOUNTER — Encounter: Payer: Self-pay | Admitting: Vascular Surgery

## 2019-11-05 ENCOUNTER — Encounter
Admission: EM | Disposition: A | Discharge status: Home / Self Care | Payer: Self-pay | Source: Home / Self Care | Attending: Obstetrics and Gynecology

## 2019-11-05 ENCOUNTER — Encounter: Payer: Self-pay | Admitting: Anesthesiology

## 2019-11-05 ENCOUNTER — Other Ambulatory Visit (INDEPENDENT_AMBULATORY_CARE_PROVIDER_SITE_OTHER): Payer: Self-pay | Admitting: Vascular Surgery

## 2019-11-05 DIAGNOSIS — Z789 Other specified health status: Secondary | ICD-10-CM

## 2019-11-05 DIAGNOSIS — N99821 Postprocedural hemorrhage and hematoma of a genitourinary system organ or structure following other procedure: Secondary | ICD-10-CM

## 2019-11-05 HISTORY — PX: ABDOMINAL HYSTERECTOMY: SHX81

## 2019-11-05 HISTORY — PX: EMBOLIZATION (CATH LAB): CATH118239

## 2019-11-05 LAB — CBC WITH DIFFERENTIAL/PLATELET
Abs Immature Granulocytes: 0.07 10*3/uL (ref 0.00–0.07)
Basophils Absolute: 0 10*3/uL (ref 0.0–0.1)
Basophils Relative: 0 %
Eosinophils Absolute: 0 10*3/uL (ref 0.0–0.5)
Eosinophils Relative: 0 %
HCT: 17.9 % — ABNORMAL LOW (ref 36.0–46.0)
Hemoglobin: 6.3 g/dL — ABNORMAL LOW (ref 12.0–15.0)
Immature Granulocytes: 0 %
Lymphocytes Relative: 12 %
Lymphs Abs: 1.9 10*3/uL (ref 0.7–4.0)
MCH: 31.3 pg (ref 26.0–34.0)
MCHC: 35.2 g/dL (ref 30.0–36.0)
MCV: 89.1 fL (ref 80.0–100.0)
Monocytes Absolute: 1.8 10*3/uL — ABNORMAL HIGH (ref 0.1–1.0)
Monocytes Relative: 11 %
Neutro Abs: 12.7 10*3/uL — ABNORMAL HIGH (ref 1.7–7.7)
Neutrophils Relative %: 77 %
Platelets: 89 10*3/uL — ABNORMAL LOW (ref 150–400)
RBC: 2.01 MIL/uL — ABNORMAL LOW (ref 3.87–5.11)
RDW: 14.8 % (ref 11.5–15.5)
WBC: 16.5 10*3/uL — ABNORMAL HIGH (ref 4.0–10.5)
nRBC: 0.2 % (ref 0.0–0.2)

## 2019-11-05 LAB — DIC PANEL (ARMC ONLY)
Fibrin derivatives D-dimer (ARMC): 4533.42 ng/mL (FEU) — ABNORMAL HIGH (ref 0.00–499.00)
Fibrin derivatives D-dimer (ARMC): 2481.22 ng/mL (FEU) — ABNORMAL HIGH (ref 0.00–499.00)
Fibrinogen: 350 mg/dL (ref 210–475)
Fibrinogen: 313 mg/dL (ref 210–475)
INR: 1.1 (ref 0.8–1.2)
INR: 1.1 (ref 0.8–1.2)
Platelets: 134 10*3/uL — ABNORMAL LOW (ref 150–400)
Platelets: 117 10*3/uL — ABNORMAL LOW (ref 150–400)
Prothrombin Time: 14 seconds (ref 11.4–15.2)
Prothrombin Time: 13.9 seconds (ref 11.4–15.2)
Smear Review: NONE SEEN
Smear Review: NONE SEEN
aPTT: 29 seconds (ref 24–36)
aPTT: 30 seconds (ref 24–36)

## 2019-11-05 LAB — PREPARE RBC (CROSSMATCH)

## 2019-11-05 LAB — MASSIVE TRANSFUSION PROTOCOL ORDER (BLOOD BANK NOTIFICATION)

## 2019-11-05 LAB — CBC
HCT: 19.9 % — ABNORMAL LOW (ref 36.0–46.0)
HCT: 24.6 % — ABNORMAL LOW (ref 36.0–46.0)
Hemoglobin: 6.9 g/dL — ABNORMAL LOW (ref 12.0–15.0)
Hemoglobin: 9 g/dL — ABNORMAL LOW (ref 12.0–15.0)
MCH: 29.6 pg (ref 26.0–34.0)
MCH: 31.9 pg (ref 26.0–34.0)
MCHC: 34.7 g/dL (ref 30.0–36.0)
MCHC: 36.6 g/dL — ABNORMAL HIGH (ref 30.0–36.0)
MCV: 85.4 fL (ref 80.0–100.0)
MCV: 87.2 fL (ref 80.0–100.0)
Platelets: 127 10*3/uL — ABNORMAL LOW (ref 150–400)
Platelets: 116 10*3/uL — ABNORMAL LOW (ref 150–400)
RBC: 2.33 MIL/uL — ABNORMAL LOW (ref 3.87–5.11)
RBC: 2.82 MIL/uL — ABNORMAL LOW (ref 3.87–5.11)
RDW: 15 % (ref 11.5–15.5)
RDW: 15.5 % (ref 11.5–15.5)
WBC: 16.5 10*3/uL — ABNORMAL HIGH (ref 4.0–10.5)
WBC: 19.5 10*3/uL — ABNORMAL HIGH (ref 4.0–10.5)
nRBC: 0.1 % (ref 0.0–0.2)
nRBC: 0.3 % — ABNORMAL HIGH (ref 0.0–0.2)

## 2019-11-05 LAB — HEMOGLOBIN AND HEMATOCRIT, BLOOD
HCT: 30.7 % — ABNORMAL LOW (ref 36.0–46.0)
Hemoglobin: 10.6 g/dL — ABNORMAL LOW (ref 12.0–15.0)

## 2019-11-05 LAB — POSTPARTUM HEMORRHAGE PROTOCOL (BB NOTIFICATION)

## 2019-11-05 LAB — RPR: RPR Ser Ql: NONREACTIVE

## 2019-11-05 SURGERY — EMBOLIZATION
Anesthesia: Moderate Sedation

## 2019-11-05 SURGERY — EXAM UNDER ANESTHESIA
Anesthesia: General

## 2019-11-05 MED ORDER — HYDROMORPHONE HCL 1 MG/ML IJ SOLN: 1.0000 mg | Freq: Once | INTRAMUSCULAR | Status: DC | PRN

## 2019-11-05 MED ORDER — LACTATED RINGERS IV SOLN: INTRAVENOUS | Status: DC

## 2019-11-05 MED ORDER — SODIUM CHLORIDE 0.9% IV SOLUTION: Freq: Once | INTRAVENOUS | Status: DC

## 2019-11-05 MED ORDER — ACETAMINOPHEN 325 MG PO TABS
650.0000 mg | ORAL_TABLET | ORAL | Status: DC | PRN
  Filled 2019-11-05: qty 2

## 2019-11-05 MED ORDER — FENTANYL CITRATE (PF) 100 MCG/2ML IJ SOLN
INTRAMUSCULAR | Status: DC | PRN
Start: 2019-11-05 — End: 2019-11-05
  Administered 2019-11-05 (×2): 50 ug via INTRAVENOUS

## 2019-11-05 MED ORDER — KETAMINE HCL 50 MG/ML IJ SOLN
INTRAMUSCULAR | Status: AC
  Filled 2019-11-05: qty 10

## 2019-11-05 MED ORDER — FENTANYL CITRATE (PF) 250 MCG/5ML IJ SOLN
INTRAMUSCULAR | Status: AC
  Filled 2019-11-05: qty 5

## 2019-11-05 MED ORDER — ONDANSETRON HCL 4 MG/2ML IJ SOLN
INTRAMUSCULAR | Status: DC | PRN
  Administered 2019-11-05: 4 mg via INTRAVENOUS

## 2019-11-05 MED ORDER — DIPHENHYDRAMINE HCL 50 MG/ML IJ SOLN: 50.0000 mg | Freq: Once | INTRAMUSCULAR | Status: DC | PRN

## 2019-11-05 MED ORDER — BENZOCAINE-MENTHOL 20-0.5 % EX AERO
1.0000 "application " | INHALATION_SPRAY | CUTANEOUS | Status: DC | PRN
  Administered 2019-11-06: 1 via TOPICAL

## 2019-11-05 MED ORDER — LACTATED RINGERS IV SOLN: INTRAVENOUS | Status: DC | PRN

## 2019-11-05 MED ORDER — MEPERIDINE HCL 25 MG/ML IJ SOLN
25.0000 mg | INTRAMUSCULAR | Status: DC | PRN
  Administered 2019-11-05: 25 mg via INTRAVENOUS
  Filled 2019-11-05: qty 1

## 2019-11-05 MED ORDER — LIDOCAINE HCL (CARDIAC) PF 100 MG/5ML IV SOSY
PREFILLED_SYRINGE | INTRAVENOUS | Status: DC | PRN
  Administered 2019-11-05: 100 mg via INTRAVENOUS

## 2019-11-05 MED ORDER — FENTANYL CITRATE (PF) 100 MCG/2ML IJ SOLN
INTRAMUSCULAR | Status: AC
Start: 2019-11-05 — End: 2019-11-06
  Filled 2019-11-05: qty 2

## 2019-11-05 MED ORDER — PROPOFOL 10 MG/ML IV BOLUS
INTRAVENOUS | Status: AC
  Filled 2019-11-05: qty 40

## 2019-11-05 MED ORDER — FENTANYL CITRATE (PF) 100 MCG/2ML IJ SOLN
INTRAMUSCULAR | Status: DC | PRN
Start: 2019-11-05 — End: 2019-11-05
  Administered 2019-11-05 (×2): 50 ug via INTRAVENOUS
  Administered 2019-11-05: 100 ug via INTRAVENOUS
  Administered 2019-11-05: 50 ug via INTRAVENOUS

## 2019-11-05 MED ORDER — IBUPROFEN 600 MG PO TABS
600.0000 mg | ORAL_TABLET | Freq: Four times a day (QID) | ORAL | Status: DC
  Administered 2019-11-05 – 2019-11-08 (×7): 600 mg via ORAL
  Filled 2019-11-05 (×7): qty 1

## 2019-11-05 MED ORDER — CEFAZOLIN SODIUM-DEXTROSE 2-4 GM/100ML-% IV SOLN
INTRAVENOUS | Status: AC
  Filled 2019-11-05: qty 100

## 2019-11-05 MED ORDER — SODIUM CHLORIDE 0.9 % IV SOLN
INTRAVENOUS | Status: DC | PRN
  Administered 2019-11-05: 30 mL/h via INTRAVENOUS

## 2019-11-05 MED ORDER — SIMETHICONE 80 MG PO CHEW: 80.0000 mg | CHEWABLE_TABLET | ORAL | Status: DC | PRN

## 2019-11-05 MED ORDER — FUROSEMIDE 10 MG/ML IJ SOLN
20.0000 mg | Freq: Once | INTRAMUSCULAR | Status: AC
  Administered 2019-11-05: 20 mg via INTRAVENOUS
  Filled 2019-11-05: qty 2

## 2019-11-05 MED ORDER — CALCIUM CHLORIDE 10 % IV SOLN
INTRAVENOUS | Status: DC | PRN
  Administered 2019-11-05: 1 g via INTRAVENOUS

## 2019-11-05 MED ORDER — ACETAMINOPHEN 10 MG/ML IV SOLN: 1000.0000 mg | Freq: Once | INTRAVENOUS | Status: DC | PRN

## 2019-11-05 MED ORDER — CEFAZOLIN SODIUM-DEXTROSE 2-4 GM/100ML-% IV SOLN
2.0000 g | Freq: Four times a day (QID) | INTRAVENOUS | Status: AC
  Administered 2019-11-05 (×3): 2 g via INTRAVENOUS
  Filled 2019-11-05 (×2): qty 100

## 2019-11-05 MED ORDER — CALCIUM CHLORIDE 10 % IV SOLN
INTRAVENOUS | Status: AC
  Filled 2019-11-05: qty 10

## 2019-11-05 MED ORDER — CEFAZOLIN SODIUM-DEXTROSE 2-4 GM/100ML-% IV SOLN: 2.0000 g | Freq: Once | INTRAVENOUS | Status: DC

## 2019-11-05 MED ORDER — SUCCINYLCHOLINE CHLORIDE 20 MG/ML IJ SOLN
INTRAMUSCULAR | Status: DC | PRN
  Administered 2019-11-05: 140 mg via INTRAVENOUS

## 2019-11-05 MED ORDER — PHENOL 1.4 % MT LIQD
1.0000 | OROMUCOSAL | Status: DC | PRN
  Filled 2019-11-05: qty 177

## 2019-11-05 MED ORDER — BUTORPHANOL TARTRATE 1 MG/ML IJ SOLN: 1.0000 mg | Freq: Once | INTRAMUSCULAR | Status: AC

## 2019-11-05 MED ORDER — ZOLPIDEM TARTRATE 5 MG PO TABS
5.0000 mg | ORAL_TABLET | Freq: Every evening | ORAL | Status: DC | PRN
  Administered 2019-11-05: 5 mg via ORAL

## 2019-11-05 MED ORDER — OXYTOCIN-SODIUM CHLORIDE 30-0.9 UT/500ML-% IV SOLN
2.5000 [IU]/h | INTRAVENOUS | Status: DC
  Administered 2019-11-05: 2.5 [IU]/h via INTRAVENOUS

## 2019-11-05 MED ORDER — OXYCODONE HCL 5 MG PO TABS: 5.0000 mg | ORAL_TABLET | Freq: Once | ORAL | Status: DC | PRN

## 2019-11-05 MED ORDER — BUTORPHANOL TARTRATE 1 MG/ML IJ SOLN
INTRAMUSCULAR | Status: AC
  Administered 2019-11-05: 1 mg via INTRAVENOUS
  Filled 2019-11-05: qty 1

## 2019-11-05 MED ORDER — METHYLPREDNISOLONE SODIUM SUCC 125 MG IJ SOLR: 125.0000 mg | Freq: Once | INTRAMUSCULAR | Status: DC | PRN

## 2019-11-05 MED ORDER — TETANUS-DIPHTH-ACELL PERTUSSIS 5-2.5-18.5 LF-MCG/0.5 IM SUSP
0.5000 mL | Freq: Once | INTRAMUSCULAR | Status: DC
  Filled 2019-11-05: qty 0.5

## 2019-11-05 MED ORDER — MIDAZOLAM HCL 2 MG/2ML IJ SOLN
INTRAMUSCULAR | Status: DC | PRN
  Administered 2019-11-05: 2 mg via INTRAVENOUS

## 2019-11-05 MED ORDER — ONDANSETRON HCL 4 MG/2ML IJ SOLN
4.0000 mg | Freq: Four times a day (QID) | INTRAMUSCULAR | Status: DC | PRN
  Administered 2019-11-05: 4 mg via INTRAVENOUS
  Filled 2019-11-05: qty 2

## 2019-11-05 MED ORDER — DIPHENHYDRAMINE HCL 25 MG PO CAPS: 25.0000 mg | ORAL_CAPSULE | Freq: Four times a day (QID) | ORAL | Status: DC | PRN

## 2019-11-05 MED ORDER — OXYTOCIN 10 UNIT/ML IJ SOLN
INTRAMUSCULAR | Status: AC
  Filled 2019-11-05: qty 4

## 2019-11-05 MED ORDER — PRENATAL MULTIVITAMIN CH
1.0000 | ORAL_TABLET | Freq: Every day | ORAL | Status: DC
  Administered 2019-11-06 – 2019-11-08 (×2): 1 via ORAL
  Filled 2019-11-05: qty 1

## 2019-11-05 MED ORDER — TRANEXAMIC ACID-NACL 1000-0.7 MG/100ML-% IV SOLN
INTRAVENOUS | Status: DC | PRN
  Administered 2019-11-05: 900 mg via INTRAVENOUS

## 2019-11-05 MED ORDER — MIDAZOLAM HCL 5 MG/5ML IJ SOLN
INTRAMUSCULAR | Status: AC
  Filled 2019-11-05: qty 5

## 2019-11-05 MED ORDER — SODIUM CHLORIDE 0.9 % IV SOLN: INTRAVENOUS | Status: DC

## 2019-11-05 MED ORDER — MIDAZOLAM HCL 2 MG/2ML IJ SOLN
INTRAMUSCULAR | Status: DC | PRN
  Administered 2019-11-05 (×2): 1 mg via INTRAVENOUS

## 2019-11-05 MED ORDER — OXYCODONE-ACETAMINOPHEN 5-325 MG PO TABS: 1.0000 | ORAL_TABLET | ORAL | Status: DC | PRN

## 2019-11-05 MED ORDER — OXYCODONE HCL 5 MG/5ML PO SOLN: 5.0000 mg | Freq: Once | ORAL | Status: DC | PRN

## 2019-11-05 MED ORDER — LACTATED RINGERS IV SOLN: 500.0000 mL | INTRAVENOUS | Status: DC | PRN

## 2019-11-05 MED ORDER — DEXAMETHASONE SODIUM PHOSPHATE 10 MG/ML IJ SOLN
INTRAMUSCULAR | Status: DC | PRN
  Administered 2019-11-05: 10 mg via INTRAVENOUS

## 2019-11-05 MED ORDER — MIDAZOLAM HCL 2 MG/2ML IJ SOLN
INTRAMUSCULAR | Status: AC
  Filled 2019-11-05: qty 2

## 2019-11-05 MED ORDER — ONDANSETRON HCL 4 MG/2ML IJ SOLN: 4.0000 mg | Freq: Once | INTRAMUSCULAR | Status: DC | PRN

## 2019-11-05 MED ORDER — TRANEXAMIC ACID 1000 MG/10ML IV SOLN
INTRAVENOUS | Status: AC
  Filled 2019-11-05: qty 10

## 2019-11-05 MED ORDER — PROPOFOL 10 MG/ML IV BOLUS
INTRAVENOUS | Status: DC | PRN
  Administered 2019-11-05: 150 mg via INTRAVENOUS

## 2019-11-05 MED ORDER — FENTANYL CITRATE (PF) 100 MCG/2ML IJ SOLN
INTRAMUSCULAR | Status: AC
  Filled 2019-11-05: qty 2

## 2019-11-05 MED ORDER — FAMOTIDINE 20 MG PO TABS: 40.0000 mg | ORAL_TABLET | Freq: Once | ORAL | Status: DC | PRN

## 2019-11-05 MED ORDER — MIDAZOLAM HCL 2 MG/ML PO SYRP: 8.0000 mg | ORAL_SOLUTION | Freq: Once | ORAL | Status: DC | PRN

## 2019-11-05 MED ORDER — MENTHOL 3 MG MT LOZG
1.0000 | LOZENGE | OROMUCOSAL | Status: DC | PRN
  Filled 2019-11-05: qty 9

## 2019-11-05 MED ORDER — FENTANYL CITRATE (PF) 100 MCG/2ML IJ SOLN: 25.0000 ug | INTRAMUSCULAR | Status: DC | PRN

## 2019-11-05 MED ORDER — OXYTOCIN-SODIUM CHLORIDE 30-0.9 UT/500ML-% IV SOLN: 2.5000 [IU]/h | INTRAVENOUS | Status: DC | PRN

## 2019-11-05 MED ORDER — IODIXANOL 320 MG/ML IV SOLN
INTRAVENOUS | Status: DC | PRN
  Administered 2019-11-05: 45 mL via INTRA_ARTERIAL

## 2019-11-05 MED ORDER — DOCUSATE SODIUM 100 MG PO CAPS
100.0000 mg | ORAL_CAPSULE | Freq: Two times a day (BID) | ORAL | Status: DC
  Administered 2019-11-07 – 2019-11-09 (×3): 100 mg via ORAL

## 2019-11-05 SURGICAL SUPPLY — 68 items
ADAPTER VACURETTE TBG SET 14 (CANNULA) IMPLANT
ADHESIVE MASTISOL STRL (MISCELLANEOUS) IMPLANT
BAG URINE DRAIN 2000ML AR STRL (UROLOGICAL SUPPLIES) ×3 IMPLANT
BALLN POSTPARTUM SOS BAKRI (BALLOONS) ×3
BALLOON POSTPARTUM SOS BAKRI (BALLOONS) ×1 IMPLANT
BLADE SURG SZ10 CARB STEEL (BLADE) IMPLANT
CANISTER SUCT 1200ML W/VALVE (MISCELLANEOUS) ×3 IMPLANT
CATH ROBINSON RED A/P 16FR (CATHETERS) IMPLANT
CHLORAPREP W/TINT 26 (MISCELLANEOUS) IMPLANT
CLOSURE WOUND 1/2 X4 (GAUZE/BANDAGES/DRESSINGS)
COVER MAYO STAND STRL (DRAPES) ×3 IMPLANT
COVER WAND RF STERILE (DRAPES) IMPLANT
CUP MEDICINE 2OZ PLAST GRAD ST (MISCELLANEOUS) IMPLANT
DRAPE LAPAROTOMY 100X77 ABD (DRAPES) IMPLANT
DRAPE LAPAROTOMY TRNSV 106X77 (MISCELLANEOUS) IMPLANT
DRSG TELFA 3X8 NADH (GAUZE/BANDAGES/DRESSINGS) IMPLANT
ELECT BLADE 6 FLAT ULTRCLN (ELECTRODE) IMPLANT
ELECT BLADE 6.5 EXT (BLADE) IMPLANT
ELECT CAUTERY BLADE 6.4 (BLADE) IMPLANT
ELECT REM PT RETURN 9FT ADLT (ELECTROSURGICAL) ×3
ELECTRODE REM PT RTRN 9FT ADLT (ELECTROSURGICAL) ×1 IMPLANT
FILTER UTR ASPR SPEC (MISCELLANEOUS) IMPLANT
FLTR UTR ASPR SPEC (MISCELLANEOUS)
GAUZE 4X4 16PLY RFD (DISPOSABLE) IMPLANT
GAUZE PACK 2X3YD (PACKING) ×3 IMPLANT
GAUZE SPONGE 4X4 12PLY STRL (GAUZE/BANDAGES/DRESSINGS) IMPLANT
GLOVE BIOGEL PI ORTHO PRO 7.5 (GLOVE) ×12
GLOVE PI ORTHO PRO STRL 7.5 (GLOVE) ×6 IMPLANT
GOWN STRL REUS W/ TWL LRG LVL3 (GOWN DISPOSABLE) ×3 IMPLANT
GOWN STRL REUS W/ TWL XL LVL3 (GOWN DISPOSABLE) IMPLANT
GOWN STRL REUS W/TWL LRG LVL3 (GOWN DISPOSABLE) ×6
GOWN STRL REUS W/TWL XL LVL3 (GOWN DISPOSABLE)
HANDLE YANKAUER SUCT BULB TIP (MISCELLANEOUS) ×3 IMPLANT
KIT BERKELEY 1ST TRIMESTER 3/8 (MISCELLANEOUS) IMPLANT
KIT TURNOVER KIT A (KITS) ×3 IMPLANT
LABEL OR SOLS (LABEL) IMPLANT
LIGASURE IMPACT 36 18CM CVD LR (INSTRUMENTS) IMPLANT
NEEDLE HYPO 25X1 1.5 SAFETY (NEEDLE) IMPLANT
NS IRRIG 1000ML POUR BTL (IV SOLUTION) ×3 IMPLANT
PACK BASIN MAJOR ARMC (MISCELLANEOUS) IMPLANT
PACK DNC HYST (MISCELLANEOUS) ×3 IMPLANT
PAD OB MATERNITY 4.3X12.25 (PERSONAL CARE ITEMS) ×3 IMPLANT
PAD PREP 24X41 OB/GYN DISP (PERSONAL CARE ITEMS) ×3 IMPLANT
PENCIL ELECTRO HAND CTR (MISCELLANEOUS) ×3 IMPLANT
RETRACTOR WND ALEXIS-O 25 LRG (MISCELLANEOUS) IMPLANT
RTRCTR WOUND ALEXIS O 25CM LRG (MISCELLANEOUS)
SET BERKELEY SUCTION TUBING (SUCTIONS) IMPLANT
SOL PREP PVP 2OZ (MISCELLANEOUS) ×3
SOLUTION PREP PVP 2OZ (MISCELLANEOUS) ×1 IMPLANT
SPONGE LAP 18X18 RF (DISPOSABLE) ×3 IMPLANT
STAPLER SKIN PROX 35W (STAPLE) IMPLANT
STRIP CLOSURE SKIN 1/2X4 (GAUZE/BANDAGES/DRESSINGS) IMPLANT
SUT VIC AB 1 CT1 18XCR BRD 8 (SUTURE) IMPLANT
SUT VIC AB 1 CT1 36 (SUTURE) IMPLANT
SUT VIC AB 1 CT1 8-18 (SUTURE)
SUT VIC AB 3-0 SH 27 (SUTURE) ×2
SUT VIC AB 3-0 SH 27X BRD (SUTURE) ×1 IMPLANT
SUT VICRYL AB 3-0 FS1 BRD 27IN (SUTURE) ×3 IMPLANT
SUT VICRYL+ 3-0 36IN CT-1 (SUTURE) IMPLANT
SYR 50ML LL SCALE MARK (SYRINGE) ×3 IMPLANT
TOWEL OR 17X26 4PK STRL BLUE (TOWEL DISPOSABLE) IMPLANT
TRAY FOLEY MTR SLVR 16FR STAT (SET/KITS/TRAYS/PACK) IMPLANT
TRAY PREP VAG/GEN (MISCELLANEOUS) IMPLANT
VACURETTE 10 RIGID CVD (CANNULA) IMPLANT
VACURETTE 12 RIGID CVD (CANNULA) IMPLANT
VACURETTE 7MM F TIP (CANNULA)
VACURETTE 7MM F TIP STRL (CANNULA) IMPLANT
VACURETTE 8 RIGID CVD (CANNULA) IMPLANT

## 2019-11-05 SURGICAL SUPPLY — 22 items
BLOCK BEAD 700-900 (Vascular Products) ×2 IMPLANT
CATH ANGIO 5F PIGTAIL 65CM (CATHETERS) ×2 IMPLANT
CATH BEACON 5 .035 65 KMP TIP (CATHETERS) ×2 IMPLANT
CATH BEACON 5 .035 65 RIM TIP (CATHETERS) ×2 IMPLANT
CATH MICROCATH PRGRT 2.8F 110 (CATHETERS) ×1 IMPLANT
COIL 400 COMPLEX SOFT 4X6CM (Vascular Products) ×4 IMPLANT
COIL 400 COMPLEX STD 4X10CM (Vascular Products) ×2 IMPLANT
COIL 400 COMPLEX STD 4X35CM (Vascular Products) ×2 IMPLANT
COIL 400 COMPLEX STD 5X12CM (Vascular Products) ×2 IMPLANT
COIL 400 COMPLEX STD 5X20CM (Vascular Products) ×2 IMPLANT
DEVICE OCCLUSION POD5 (Vascular Products) ×1 IMPLANT
DEVICE STARCLOSE SE CLOSURE (Vascular Products) ×2 IMPLANT
DEVICE TORQUE .025-.038 (MISCELLANEOUS) ×2 IMPLANT
GLIDEWIRE STIFF .35X180X3 HYDR (WIRE) ×2 IMPLANT
HANDLE DETACHMENT COIL (MISCELLANEOUS) ×2 IMPLANT
MICROCATH PROGREAT 2.8F 110 CM (CATHETERS) ×2
OCCLUSION DEVICE POD5 (Vascular Products) ×2 IMPLANT
PACK ANGIOGRAPHY (CUSTOM PROCEDURE TRAY) ×2 IMPLANT
SHEATH BRITE TIP 5FRX11 (SHEATH) ×2 IMPLANT
SYR MEDRAD MARK 7 150ML (SYRINGE) ×2 IMPLANT
TUBING CONTRAST HIGH PRESS 72 (TUBING) ×4 IMPLANT
WIRE J 3MM .035X145CM (WIRE) ×2 IMPLANT

## 2019-11-05 NOTE — Anesthesia Preprocedure Evaluation (Signed)
Anesthesia Evaluation  Patient identified by MRN, date of birth, ID band Patient awake  General Assessment Comment:In distress and discomfort  Reviewed: Allergy & Precautions, NPO status , Patient's Chart, lab work & pertinent test results  History of Anesthesia Complications Negative for: history of anesthetic complications  Airway Mallampati: III  TM Distance: >3 FB Neck ROM: Full    Dental no notable dental hx. (+) Teeth Intact, Dental Advisory Given   Pulmonary neg pulmonary ROS, neg sleep apnea, neg COPD, Patient abstained from smoking.Not current smoker,    Pulmonary exam normal breath sounds clear to auscultation       Cardiovascular Exercise Tolerance: Good METS(-) hypertension(-) CAD and (-) Past MI negative cardio ROS  (-) dysrhythmias  Rhythm:Regular Rate:Normal - Systolic murmurs    Neuro/Psych negative neurological ROS  negative psych ROS   GI/Hepatic neg GERD  ,(+)     (-) substance abuse  ,   Endo/Other  diabetes, Gestational  Renal/GU negative Renal ROS     Musculoskeletal   Abdominal   Peds  Hematology   Anesthesia Other Findings Past Medical History: No date: Gestational diabetes 10/16/2017: History of chlamydia infection  Reproductive/Obstetrics                             Anesthesia Physical Anesthesia Plan  ASA: IV and emergent  Anesthesia Plan: General   Post-op Pain Management:    Induction: Intravenous and Rapid sequence  PONV Risk Score and Plan: 4 or greater and Ondansetron, Dexamethasone, Midazolam and Treatment may vary due to age or medical condition  Airway Management Planned: Oral ETT and Video Laryngoscope Planned  Additional Equipment: None  Intra-op Plan:   Post-operative Plan: Possible Post-op intubation/ventilation  Informed Consent: I have reviewed the patients History and Physical, chart, labs and discussed the procedure including the  risks, benefits and alternatives for the proposed anesthesia with the patient or authorized representative who has indicated his/her understanding and acceptance.     Dental advisory given  Plan Discussed with: CRNA and Surgeon  Anesthesia Plan Comments: (Discussed risks of anesthesia with patient, including PONV, sore throat, lip/dental damage. Rare risks discussed as well, such as cardiorespiratory and neurological sequelae. Discussed possibility of prolonged intubation. Possibility of  Continued blood product transfusion. Patient understands.  Has received 4u pRBC and 2u FFP so far. Will order platelets)        Anesthesia Quick Evaluation

## 2019-11-05 NOTE — Op Note (Signed)
[facility] Percutaneous Study/Intervention Procedural Note   Date of Surgery: 11/05/2019  Surgeon(s): Festus Barren  Assistants:none  Pre-operative Diagnosis: Massive postpartum hemorrhage  Post-operative diagnosis: Same  Procedure(s) Performed: 1. Ultrasound guidance for vascular access right femoral artery 2. Catheter placement into left uterine artery and right uterine artery from right femoral approach 3. Aortogram and selective pelvic arteriograms including selective imaging of both uterine arteries 4. Micro-bead embolization with 5 cc of 700-900  polyvinyl alcohol beads to the left uterine artery and a total of 3 Ruby coils 5. Micro-bead embolization with 5 cc of 700 900  polyvinyl alcohol beads to the right uterine artery and a total of 4 Ruby coils  6. StarClose closure device right femoral artery  EBL: 10 cc  Contrast: 45 cc  Fluoro Time: 10.6 min  Moderate Conscious Sedation time: approximately 65 minutes using 2 mg of Versed and 100 mcg of Fentanyl  Indications: Patient is a 19 y.o. female with massive postpartum hemorrhage in need of a uterine artery embolization for life-threatening hemorrhage. Risks and benefits are discussed and informed consent is obtained  Procedure: The patient was identified and appropriate procedural time out was performed. The patient was then placed supine on the table and prepped and draped in the usual sterile fashion. Moderate conscious sedation was administered throughout the procedure with a face to face encounter with the patient throughout and with my supervision of the RN administering medicines and monitoring the patients vital signs and mental status throughout from the start of the procedure until the patient was taken to the recovery room.  Ultrasound was used to evaluate the right common femoral artery. It was patent . A  digital ultrasound image was acquired. A Seldinger needle was used to access the right common femoral artery under direct ultrasound guidance and a permanent image was performed. A 0.035 J wire was advanced without resistance and a 5Fr sheath was placed. Pigtail catheter was placed into the aorta and an AP aortogram was performed. This demonstrated normal renal arteries and normal aorta and iliac segments without significant stenosis. On the initial imaging, significant flow through larger than average uterine arteries was identified and on the initial aortogram. I then crossed the aortic bifurcation and advanced to the left iliac bifurcation. Using a rim catheter, I was able to cannulate the hypogastric artery on the left. Selective imaging was performed of the hypogastric artery which demonstrated the uterine artery coming off as a lateral branch and technically the third branch off of the main hypogastric artery. Using a rim catheter and then a progreat micro catheter, I was able to cannulate the left uterine artery and advance beyond its initial branches well into the left uterine artery.  Selective imaging was performed. I then performed left uterine artery embolization using approximately 5 cc of 700-900  polyvinyl alcohol beads into the left uterine artery for selective embolization until there was minimal forward flow into the uterine artery going to the uterus.  I then added a total of 3 Ruby coils in the left uterine artery.  The first was a 4 mm x 6 cm, the second was a 4 mm x 10 cm, and the third was a 4 mm x 6 cm coil.  Following this, there was essentially no flow in the left uterine artery when imaging was performed and the rim catheter in the main hypogastric artery.  The microcatheter was then removed. I then turned my attention to the right uterine artery. I was  able to cannulate the right hypogastric artery with the rim catheter from the right femoral approach and using a rim catheter and  Glidewire advanced this into the main right hypogastric artery. Selective imaging was performed in the right hypogastric artery which demonstrated the right uterine artery to be the first branch off of the main hypogastric artery and this was a lateral going branch.  I was able to advance the pro-great microcatheter into the uterine artery and down beyond its primary branches near where it entered the uterus itself. Selective imaging was performed. Approximately 5 cc of the 709 100  polyvinyl alcohol beads were then instilled into the right uterine artery for selective embolization until there was minimal forward flow into the uterus from the right uterine artery.  I then performed coil embolization with a series of Ruby coils.  We started with a 4 mm x 35 cm standard, and follow this with a 5 mm x 20 and a 5 mm x 12 cm standard.  The last coil was a POD 5 30 cm.  Following this, there was no forward flow in the right uterine artery.  The diagnostic catheter was then removed. I elected to terminate the procedure. The sheath was removed and StarClose closure device was deployed in the right femoral artery with excellent hemostatic result. The patient was taken to the recovery room in stable condition having tolerated the procedure well.      Disposition: Patient was taken to the recovery room in stable condition having tolerated the procedure well.  Complications: None

## 2019-11-05 NOTE — Progress Notes (Signed)
Chart Note (Maternal-Fetal Medicine)  I received a call from Dr. Logan Bores at 11:20 AM to discuss management of Ms. Malerie Devery.  Ms. Limon is a 19 year old P1 who delivered vaginally last night. Briefly, following delivery, she had postpartum hemorrhage. After uterine curettage, Bakri balloon was placed. Since the bleeding did not stop, the patient was taken to the OR by Dr. Logan Bores and curettage was performed again and the Bakri balloon was replaced. Patient is being transfused blood in addition to 3 units of FFP and 1 unit of platelets.  CBC at 08:28- Hb 6.9, Hct 19.9, PLT 127, WBC 16.5, Fib 313, INR 1.1  Vascular surgeon was consulted who will be performing embolization of internal iliac/uterine artery.  We discussed hysterectomy, which is recommended if IR procedure fails to control uterine bleeding. If Dr. Logan Bores or his colleagues are unable to perform hysterectomy at [facility], the patient should be transferred to [facility], [place] or other facilities including [facility] or [facility].   I recommend considering transfer if the patient is stable after IR procedure.  I discussed with Dr. Donavan Foil (Ob Attending) and Dr. Shawnie Pons who are willing to accept the transfer.  Dr. Logan Bores was given the contact number of our Ob Attending [phone]).

## 2019-11-05 NOTE — Anesthesia Procedure Notes (Signed)
Procedure Name: Intubation Date/Time: 11/05/2019 6:40 AM Performed by: Corinda Gubler, MD Pre-anesthesia Checklist: Patient identified, Patient being monitored, Timeout performed, Emergency Drugs available and Suction available Patient Re-evaluated:Patient Re-evaluated prior to induction Oxygen Delivery Method: Circle system utilized Preoxygenation: Pre-oxygenation with 100% oxygen Induction Type: IV induction and Rapid sequence Ventilation: Oral airway inserted - appropriate to patient size and Two handed mask ventilation required Laryngoscope Size: 3 and McGraph Grade View: Grade I Tube type: Oral Tube size: 6.5 mm Number of attempts: 2 Airway Equipment and Method: Stylet Placement Confirmation: ETT inserted through vocal cords under direct vision,  positive ETCO2 and breath sounds checked- equal and bilateral Secured at: 20 cm Tube secured with: Tape Dental Injury: Teeth and Oropharynx as per pre-operative assessment  Comments: First attempt with ETT getting caught on vocal cords. BVM ventilation afterward with two handed mask vent and oral airway. Second attempt uneventful and successful. No evidence of regurgitation or aspiration.

## 2019-11-05 NOTE — OR Nursing (Signed)
The patient came to the OR with a Bakri balloon tamponade in place.  Upon intubation, the patient coughed and the Bakri came out. A new Bakri was placed by the surgeon.   The surgeon also inserted vaginal packing which remained in place when the patient was taken to PACU.  Darla Lesches, RN

## 2019-11-05 NOTE — Transfer of Care (Signed)
Immediate Anesthesia Transfer of Care Note  Patient: Kathleen Barnes  Procedure(s) Performed: exam under anesthesia, bakri placement, endometrial curretage (N/A )  Patient Location: PACU  Anesthesia Type:General  Level of Consciousness: sedated and patient cooperative  Airway & Oxygen Therapy: Patient Spontanous Breathing and Patient connected to nasal cannula oxygen  Post-op Assessment: Report given to RN and Post -op Vital signs reviewed and stable  Post vital signs: Reviewed and stable  Last Vitals:  Vitals Value Taken Time  BP    Temp    Pulse 115 11/05/19 0710  Resp 23 11/05/19 0710  SpO2 100 % 11/05/19 0710  Vitals shown include unvalidated device data.  Last Pain:  Vitals:   11/05/19 0459  TempSrc: Oral  PainSc:          Complications: No complications documented.

## 2019-11-05 NOTE — Progress Notes (Signed)
1st 2 units of pRBC's issued are O+ blood because pt has been typed and screened already for O+ blood. As with MTP protocol, units issued were not O-, therefore am unable to sign out units in eMAR. Blood bank notified and ok with hanging units as they have been verified with blood bank and 2 RN sign off.

## 2019-11-05 NOTE — Progress Notes (Signed)
Stop Pitocin IV and pt may have a regular diet per conversation with Dr Valentino Saxon

## 2019-11-05 NOTE — Progress Notes (Signed)
Additional 50 added to [facility] for total of 300

## 2019-11-05 NOTE — Progress Notes (Signed)
Small amt of blood noted and weighed. Pt states with last bleed all the sudden she felt the blood as if it happened suddenly. Instructed to call if it happens again. Will reevaluate in 30 min prn

## 2019-11-05 NOTE — Consult Note (Signed)
[facility] Vascular Consult Note  MRN : [id number]  Meris Earlene PlaterS Lapoint is a 19 y.o. (18-Apr-2000) female who presents with chief complaint of postpartum blood loss.  History of Present Illness:  The patient is a 19 year old female with no significant past medical history s/p vaginal delivery this AM.  Patient with vaginal bleeding status post delivery.  Patient with abdominal discomfort.  Patient was transfused packed red blood cells and FFP for symptomatic anemia.  Vascular surgery was emergently consulted by Dr. Logan BoresEvans for possible endovascular intervention.  Current Facility-Administered Medications  Medication Dose Route Frequency Provider Last Rate Last Admin  . [MAR Hold] 0.9 %  sodium chloride infusion (Manually program via Guardrails IV Fluids)   Intravenous Once Linzie CollinEvans, David James, MD      . Mitzi Hansen[MAR Hold] 0.9 %  sodium chloride infusion (Manually program via Guardrails IV Fluids)   Intravenous Once Linzie CollinEvans, David James, MD      . Mitzi Hansen[MAR Hold] acetaminophen (TYLENOL) tablet 650 mg  650 mg Oral Q4H PRN Linzie CollinEvans, David James, MD      . Mitzi Hansen[MAR Hold] acetaminophen (TYLENOL) tablet 650 mg  650 mg Oral Q4H PRN Linzie CollinEvans, David James, MD      . Mitzi Hansen[MAR Hold] benzocaine-Menthol (DERMOPLAST) 20-0.5 % topical spray 1 application  1 application Topical PRN Linzie CollinEvans, David James, MD      . Mitzi Hansen[MAR Hold] benzocaine-Menthol (DERMOPLAST) 20-0.5 % topical spray 1 application  1 application Topical PRN Linzie CollinEvans, David James, MD      . Mitzi Hansen[MAR Hold] diphenhydrAMINE (BENADRYL) capsule 25 mg  25 mg Oral Q6H PRN Linzie CollinEvans, David James, MD      . Mitzi Hansen[MAR Hold] diphenhydrAMINE (BENADRYL) capsule 25 mg  25 mg Oral Q6H PRN Linzie CollinEvans, David James, MD      . Mitzi Hansen[MAR Hold] docusate sodium (COLACE) capsule 100 mg  100 mg Oral BID Linzie CollinEvans, David James, MD   100 mg at 11/05/19 0926  . [MAR Hold] docusate sodium (COLACE) capsule 100 mg  100 mg Oral BID Linzie CollinEvans, David James, MD      . fentaNYL (SUBLIMAZE) 100 MCG/2ML injection           .  fentaNYL (SUBLIMAZE) 100 MCG/2ML injection           . fentaNYL (SUBLIMAZE) injection    PRN Annice Needyew, Jason S, MD   50 mcg at 11/05/19 1305  . [MAR Hold] ibuprofen (ADVIL) tablet 600 mg  600 mg Oral Q6H Linzie CollinEvans, David James, MD      . Mitzi Hansen[MAR Hold] ibuprofen (ADVIL) tablet 600 mg  600 mg Oral Q6H Linzie CollinEvans, David James, MD   600 mg at 11/05/19 0925  . lactated ringers infusion   Intravenous Continuous Linzie CollinEvans, David James, MD 75 mL/hr at 11/05/19 0912 New Bag at 11/05/19 0912  . [MAR Hold] menthol-cetylpyridinium (CEPACOL) lozenge 3 mg  1 lozenge Oral PRN Linzie CollinEvans, David James, MD      . midazolam (VERSED) 5 MG/5ML injection           . midazolam (VERSED) injection    PRN Annice Needyew, Jason S, MD   1 mg at 11/05/19 1305  . [MAR Hold] oxyCODONE-acetaminophen (PERCOCET/ROXICET) 5-325 MG per tablet 1 tablet  1 tablet Oral Q4H PRN Linzie CollinEvans, David James, MD      . Mitzi Hansen[MAR Hold] oxyCODONE-acetaminophen (PERCOCET/ROXICET) 5-325 MG per tablet 1 tablet  1 tablet Oral Q4H PRN Linzie CollinEvans, David James, MD      . oxytocin (PITOCIN) IV infusion 30 units in NS 500 mL -  Premix  2.5 Units/hr Intravenous Continuous PRN Linzie Collin, MD      . oxytocin (PITOCIN) IV infusion 30 units in NS 500 mL - Premix  2.5 Units/hr Intravenous Continuous PRN Linzie Collin, MD      . oxytocin (PITOCIN) IV infusion 30 units in NS 500 mL - Premix  2.5 Units/hr Intravenous Continuous Linzie Collin, MD 41.7 mL/hr at 11/05/19 0911 2.5 Units/hr at 11/05/19 0911  . [MAR Hold] phenol (CHLORASEPTIC) mouth spray 1 spray  1 spray Mouth/Throat PRN Linzie Collin, MD      . Mitzi Hansen Hold] prenatal multivitamin tablet 1 tablet  1 tablet Oral Q1200 Linzie Collin, MD      . Mitzi Hansen Hold] prenatal multivitamin tablet 1 tablet  1 tablet Oral Q1200 Linzie Collin, MD      . Mitzi Hansen Hold] simethicone [facility]) chewable tablet 80 mg  80 mg Oral PRN Linzie Collin, MD      . Mitzi Hansen Hold] simethicone [facility]) chewable tablet 80 mg  80 mg Oral PRN Linzie Collin, MD       . Mitzi Hansen Hold] Tdap (BOOSTRIX) injection 0.5 mL  0.5 mL Intramuscular Once Linzie Collin, MD      . Mitzi Hansen Hold] Tdap (BOOSTRIX) injection 0.5 mL  0.5 mL Intramuscular Once Linzie Collin, MD      . Mitzi Hansen Hold] zolpidem [facility]) tablet 5 mg  5 mg Oral QHS PRN Linzie Collin, MD      . Mitzi Hansen Hold] zolpidem [facility]) tablet 5 mg  5 mg Oral QHS PRN Linzie Collin, MD       Past Medical History:  Diagnosis Date  . Gestational diabetes   . History of chlamydia infection 10/16/2017   Past Surgical History:  Procedure Laterality Date  . no surgical history     Social History Social History   Tobacco Use  . Smoking status: Never Smoker  . Smokeless tobacco: Never Used  Vaping Use  . Vaping Use: Never used  Substance Use Topics  . Alcohol use: No  . Drug use: No   Family History Family History  Problem Relation Age of Onset  . Hypertension Mother   . Healthy Father   . Heart attack Maternal Grandmother   . Migraines Maternal Grandmother   Denies any family history of peripheral artery, venous disease or bleeding/clotting disorders.  REVIEW OF SYSTEMS (Negative unless checked)  Constitutional: [] Weight loss  [] Fever  [] Chills Cardiac: [] Chest pain   [] Chest pressure   [] Palpitations   [] Shortness of breath when laying flat   [] Shortness of breath at rest   [] Shortness of breath with exertion. Vascular:  [] Pain in legs with walking   [] Pain in legs at rest   [] Pain in legs when laying flat   [] Claudication   [] Pain in feet when walking  [] Pain in feet at rest  [] Pain in feet when laying flat   [] History of DVT   [] Phlebitis   [] Swelling in legs   [] Varicose veins   [] Non-healing ulcers Pulmonary:   [] Uses home oxygen   [] Productive cough   [] Hemoptysis   [] Wheeze  [] COPD   [] Asthma Neurologic:  [] Dizziness  [] Blackouts   [] Seizures   [] History of stroke   [] History of TIA  [] Aphasia   [] Temporary blindness   [] Dysphagia   [] Weakness or numbness in arms   [] Weakness or  numbness in legs Musculoskeletal:  [] Arthritis   [] Joint swelling   [] Joint pain   [] Low back  pain Hematologic:  [] Easy bruising  [] Easy bleeding   [] Hypercoagulable state   [x] Anemic  [] Hepatitis Gastrointestinal:  [] Blood in stool   [] Vomiting blood  [] Gastroesophageal reflux/heartburn   [] Difficulty swallowing. Genitourinary:  [] Chronic kidney disease   [] Difficult urination  [] Frequent urination  [] Burning with urination   [] Blood in urine Skin:  [] Rashes   [] Ulcers   [] Wounds Psychological:  [] History of anxiety   []  History of major depression.  Positive for abdominal pain and delivery vaginal bleeding  Physical Examination  Vitals:   11/05/19 1255 11/05/19 1300 11/05/19 1305 11/05/19 1310  BP: 120/84 (!) 110/92 (!) 146/89 (!) 139/101  Pulse:      Resp: 15 17 15 13   Temp: 99.8 F (37.7 C)     TempSrc: Oral     SpO2: 100% 100% 100% 98%  Weight:      Height:       Body mass index is 36.6 kg/m. Gen:  WD/WN, NAD Head: [place]/AT, No temporalis wasting. Prominent temp pulse not noted. Ear/Nose/Throat: Hearing grossly intact, nares w/o erythema or drainage, oropharynx w/o Erythema/Exudate Eyes: Sclera non-icteric, conjunctiva clear Neck: Trachea midline.  No JVD.  Pulmonary:  Good air movement, respirations not labored, equal bilaterally.  Cardiac: RRR, normal S1, S2. Vascular:  Vessel Right Left  Radial Palpable Palpable  Ulnar Palpable Palpable  Brachial Palpable Palpable  Carotid Palpable, without bruit Palpable, without bruit  Aorta Not palpable N/A  Femoral Palpable Palpable  Popliteal Palpable Palpable  PT Palpable Palpable  DP Palpable Palpable   Gastrointestinal: soft, non-tender/non-distended. No guarding/reflex.  Musculoskeletal: M/S 5/5 throughout.  Extremities without ischemic changes.  No deformity or atrophy. No edema. Neurologic: Sensation grossly intact in extremities.  Symmetrical.  Speech is fluent. Motor exam as listed above. Psychiatric: Judgment intact,  Mood & affect appropriate for pt's clinical situation. Dermatologic: No rashes or ulcers noted.  No cellulitis or open wounds. Lymph : No Cervical, Axillary, or Inguinal lymphadenopathy.  CBC Lab Results  Component Value Date   WBC 16.5 (H) 11/05/2019   HGB 6.9 (L) 11/05/2019   HCT 19.9 (L) 11/05/2019   MCV 85.4 11/05/2019   PLT 127 (L) 11/05/2019   PLT 117 (L) 11/05/2019   BMET No results found for: NA, K, CL, CO2, GLUCOSE, BUN, CREATININE, CALCIUM, GFRNONAA, GFRAA CrCl cannot be calculated (No successful lab value found.).  COAG Lab Results  Component Value Date   INR 1.1 11/05/2019   INR 1.1 11/05/2019   Radiology OB Follow Up  Result Date: 10/31/2019 Patient Name: DONNETTA GILLIN DOB: 12-18-2000 MRN: ULTRASOUND REPORT Location: [facility] Date of Service: 10/29/2019 Indications:growth/afi Findings: intrauterine pregnancy is visualized with FHR at 161 BPM. . Fetal presentation is Cephalic. Placenta: posterior. Grade: 1 AFI: 18 cm Growth percentile is 51. EFW: 2931 g ( 6 lbs 7 oz) Impression: 1. [redacted]w[redacted]d Viable Singleton Intrauterine pregnancy previously established criteria. 2. Growth is 51 %ile.  AFI is 18 cm. Recommendations: 1.Clinical correlation with the patient's History and Physical Exam. Jenine  M. 01/05/20     RDMS The ultrasound images and findings were reviewed by me and I agree with the above report. 01/05/20, M.D. 10/31/2019 12:08 PM  OB Follow Up  Result Date: 10/09/2019 Patient Name: OLEVIA WESTERVELT DOB: 2000/02/13 MRN: 01/05/2020 ULTRASOUND REPORT Location: [facility] Date of Service: 10/09/2019 Indications:growth/afi Findings: 01/05/2020 intrauterine pregnancy is visualized with FHR at 150 BPM. Fetal presentation is Cephalic. Placenta: posterior. Grade: 1. Marginal cord insertion. AFI: 18.9  cm Growth percentile is 37. EFW: 2039 g ( 4 lbs 8 oz) Impression: 1. [redacted]w[redacted]d Viable Singleton Intrauterine pregnancy previously established criteria. 2.  Growth is 37 %ile.  AFI is 18.9 cm. 3. Echogenic foci is still seen in the fetal heart. Recommendations: 1.Clinical correlation with the patient's History and Physical Exam. Jenine  M. Marciano Sequin     RDMS The ultrasound images and findings were reviewed by me and I agree with the above report. Elonda Husky, M.D. 10/09/2019 2:30 PM  Assessment/Plan The patient is a 19 year old no significant past medical history status post vaginal delivery with postpartum hemorrhage  1.  Post-partum hemorrhage: Patient status post vaginal delivery with postpartum hemorrhage requiring transfusion of packed red blood cells and FFP.  Vascular surgery was consulted emergently for possible endovascular intervention.  We will plan on uterine embolization and attempt to achieve hemostasis.  Procedure, risks and benefits including but not limited to the difficulties with future pregnancies.  All questions were answered.  Patient wishes to proceed.  Patient will be taken emergently to the endovascular suite.  Discussed with Dr. Weldon Inches, PA-C  11/05/2019 1:13 PM  This note was created with Dragon medical transcription system.  Any error is purely unintentional

## 2019-11-05 NOTE — Progress Notes (Signed)
3rd Unit started per Massive transfusion protocol [id number]

## 2019-11-05 NOTE — Progress Notes (Signed)
Called by L&D for an additional postpartum blood loss of 650 mL.  Stat H&H ordered.  H&H returned with hemoglobin of 7 patient moderately symptomatic.  Nurse explained that while waiting for the H&H patient had lost a significant amount of additional blood.  I asked her to begin the massive transfusion protocol and to prepare the instrument tray for postpartum hemorrhage including the [facility] balloon. I explained the situation to the patient, father of the baby and the patient's mother and they voiced their understanding.  All of their questions were answered.  Consent was given to proceed. 1 mg of intravenous Stadol was given.  By this time packed red cells were running. A weighted speculum was placed posteriorly ring forceps were placed on the cervix and the cervix and vagina was carefully inspected in a systematic manner.  No tears of the vagina or cervix were noted.  There was no blood coming directly from the cervix rather only out of the cervix.  A continuous trickle was present.  Vigorous palpation of the uterine fundus produced several large clots despite what palpated as a firm uterus.  A banjo curette was placed within the endometrial cavity and a systematic curettage was performed.  No additional placenta or retained products were noted.  Some small clots were identified.  A ring forceps was placed within the endometrial cavity and again this was not productive of any retained products. Despite vigorous fundal massage the patient continued to to experience bleeding from the cervical os.(Throughout most of this process and her postpartum period, the uterus was noted to be firm with good tone. However this did not result in a decrease of her bleeding.) The Bakri balloon tray was opened and the balloon placed through the cervical os well within the endometrial cavity.  250 cc of sterile saline was used to inflate the balloon.  A small amount of blood was noted trickling from the other Bakri port but this  soon stopped.  Her vaginal bleeding was noted to completely stop. During this entire procedure the patient tolerated this well. Additional units of blood were ordered as well as FFP. Obviously we will continue to follow blood loss. We will continue to transfuse as appropriate.

## 2019-11-05 NOTE — Progress Notes (Signed)
Pt feels like her perineum is wet. Check of pad shows moderate amt of blood. Readjusted pt and changed her position and blood with fine spray noted.

## 2019-11-05 NOTE — Op Note (Signed)
    I was called to labor and delivery because the patient was noted to have an enlarging uterus with no further drainage of the the Bakri balloon.  She was becoming uncomfortable with her enlarging uterus.  On examination her uterus was noted to be above the umbilicus slightly dextrorotated and very tender.  She was experiencing some vaginal bleeding which appeared to be coming from around the back reballoon and out of the cervix on cursory examination.  I discussed the neck step of taking the patient to the operating room with both the patient and her mother.  I also discussed the possibility of hysterectomy and other invasive measures like uterine artery embolization.  Prior to being taken to the operating room the patient received a total of 4 units of packed cells, 3 units of fresh frozen plasma and 1 of platelets.  She was hemodynamically stable when taken to the OR.   OPERATIVE NOTE 11/05/2019 8:03 AM  PRE-OPERATIVE DIAGNOSIS:  1) hemorrhage post op vaginal delivery  POST-OPERATIVE DIAGNOSIS:  1) Same  OPERATION: Vaginal exploration of the vulva vagina cervix and uterus.   Curettage of the endometrial cavity   Placement of Bakri balloon   Repair of superficial vulvar tear  SURGEON(S): Surgeon(s) and Role:    Linzie Collin, MD - Primary    * Hildred Laser, MD - Assisting No other capable assistant was available for this surgery which requires an experienced, high level assistant.  She provided exposure, dissection, suctioning, retraction, and general support and assistance during the procedure.   ANESTHESIA: General  ESTIMATED BLOOD LOSS: 120 mL after surgery was begun.  OPERATIVE FINDINGS: No vulvar vaginal or cervical tears.  No retained products of conception in the uterus.  SPECIMEN: * No specimens in log *  COMPLICATIONS: None  DRAINS: Foley to gravity.  Bakri balloon  DISPOSITION: Stable to recovery room  DESCRIPTION OF PROCEDURE:      The patient was  placed in the dorsal lithotomy position and placed under general anesthesia.  During induction of anesthesia, intubation was somewhat difficult and the patient underwent some coughing and bucking at which time she expelled the inflated Bakri balloon and a large amount of blood and clots.  After intubation was complete I prepped and draped her appropriately.  A weighted speculum was placed posteriorly and we carefully inspected the vulva vagina and cervix for tears.  None were noted.  At this time the patient had a slow trickle of blood but nothing like what she was experiencing prior to going to the OR.  I once again performed a curettage of the endometrium with a large curette.  This did not produce any products of conception.  Her bleeding remained minimal to moderate.  TXA was given and potassium was run in the IV.  Her uterus was noted to be firm.  I decided to place another Bakri balloon.  Vaginal packing was moistened and placed in the vagina.  A small right vulvar tear from the edge of the weighted speculum was noted in 2 interrupted sutures of 3-0 Vicryl were placed with good hemostasis and approximation.  The weighted speculum was removed and the patient went to recovery room in stable condition.  No follow-up provider specified.  Elonda Husky, M.D. 11/05/2019 8:03 AM

## 2019-11-05 NOTE — Progress Notes (Signed)
Talked with Dr Clayburn Pert, Plan for uterine artery embolization. Discussed increased rate of blood transfusion to MTP before procedure. Blood transfusion rate increased to 962ml/hr. Pt and family informed of POC.

## 2019-11-05 NOTE — OR Nursing (Signed)
L and D nurse Sarah at bedside checked on patient and check fundus, patient stable and going to room.

## 2019-11-05 NOTE — Progress Notes (Signed)
4th unit RBCs given. Order is for MTP this is typed and screeened blood [id number]

## 2019-11-05 NOTE — Progress Notes (Signed)
Dr Logan Bores MD notified of change in VS- increase in temp to 99.1 and BP 134/101 after administration of new unit of blood. MD order to continue administration and continue to monitor.

## 2019-11-06 ENCOUNTER — Encounter: Payer: Self-pay | Admitting: Vascular Surgery

## 2019-11-06 DIAGNOSIS — D62 Acute posthemorrhagic anemia: Secondary | ICD-10-CM

## 2019-11-06 DIAGNOSIS — R7309 Other abnormal glucose: Secondary | ICD-10-CM

## 2019-11-06 DIAGNOSIS — E538 Deficiency of other specified B group vitamins: Secondary | ICD-10-CM

## 2019-11-06 LAB — BPAM PLATELET PHERESIS
Blood Product Expiration Date: 202110072359
ISSUE DATE / TIME: 202110060544
Unit Type and Rh: 6200

## 2019-11-06 LAB — BPAM FFP
Blood Product Expiration Date: 202110102359
Blood Product Expiration Date: 202110102359
Blood Product Expiration Date: 202110102359
Blood Product Expiration Date: 202110112359
Blood Product Expiration Date: 202110112359
ISSUE DATE / TIME: 202110060226
ISSUE DATE / TIME: 202110060226
ISSUE DATE / TIME: 202110060226
ISSUE DATE / TIME: 202110061609
ISSUE DATE / TIME: 202110061805
Unit Type and Rh: 1700
Unit Type and Rh: 7300
Unit Type and Rh: 7300
Unit Type and Rh: 7300
Unit Type and Rh: 5100

## 2019-11-06 LAB — DIC PANEL (ARMC ONLY)
Fibrin derivatives D-dimer (ARMC): 840.08 ng/mL (FEU) — ABNORMAL HIGH (ref 0.00–499.00)
Fibrinogen: 420 mg/dL (ref 210–475)
INR: 0.9 (ref 0.8–1.2)
Platelets: 66 10*3/uL — ABNORMAL LOW (ref 150–400)
Prothrombin Time: 12.2 seconds (ref 11.4–15.2)
Smear Review: NONE SEEN
aPTT: 27 seconds (ref 24–36)

## 2019-11-06 LAB — CBC WITH DIFFERENTIAL/PLATELET
Abs Immature Granulocytes: 0.13 10*3/uL — ABNORMAL HIGH (ref 0.00–0.07)
Abs Immature Granulocytes: 0.53 10*3/uL — ABNORMAL HIGH (ref 0.00–0.07)
Basophils Absolute: 0 10*3/uL (ref 0.0–0.1)
Basophils Absolute: 0 10*3/uL (ref 0.0–0.1)
Basophils Relative: 0 %
Basophils Relative: 0 %
Eosinophils Absolute: 0 10*3/uL (ref 0.0–0.5)
Eosinophils Absolute: 0 10*3/uL (ref 0.0–0.5)
Eosinophils Relative: 0 %
Eosinophils Relative: 0 %
HCT: 15.3 % — ABNORMAL LOW (ref 36.0–46.0)
HCT: 20.9 % — ABNORMAL LOW (ref 36.0–46.0)
Hemoglobin: 5.6 g/dL — ABNORMAL LOW (ref 12.0–15.0)
Hemoglobin: 7.3 g/dL — ABNORMAL LOW (ref 12.0–15.0)
Immature Granulocytes: 1 %
Immature Granulocytes: 4 %
Lymphocytes Relative: 15 %
Lymphocytes Relative: 14 %
Lymphs Abs: 2.1 10*3/uL (ref 0.7–4.0)
Lymphs Abs: 1.7 10*3/uL (ref 0.7–4.0)
MCH: 31.8 pg (ref 26.0–34.0)
MCH: 30.7 pg (ref 26.0–34.0)
MCHC: 36.6 g/dL — ABNORMAL HIGH (ref 30.0–36.0)
MCHC: 34.9 g/dL (ref 30.0–36.0)
MCV: 86.9 fL (ref 80.0–100.0)
MCV: 87.8 fL (ref 80.0–100.0)
Monocytes Absolute: 1.5 10*3/uL — ABNORMAL HIGH (ref 0.1–1.0)
Monocytes Absolute: 1.4 10*3/uL — ABNORMAL HIGH (ref 0.1–1.0)
Monocytes Relative: 11 %
Monocytes Relative: 11 %
Neutro Abs: 9.8 10*3/uL — ABNORMAL HIGH (ref 1.7–7.7)
Neutro Abs: 8.8 10*3/uL — ABNORMAL HIGH (ref 1.7–7.7)
Neutrophils Relative %: 73 %
Neutrophils Relative %: 71 %
Platelets: 79 10*3/uL — ABNORMAL LOW (ref 150–400)
Platelets: 69 10*3/uL — ABNORMAL LOW (ref 150–400)
RBC: 1.76 MIL/uL — ABNORMAL LOW (ref 3.87–5.11)
RBC: 2.38 MIL/uL — ABNORMAL LOW (ref 3.87–5.11)
RDW: 15.1 % (ref 11.5–15.5)
RDW: 16.3 % — ABNORMAL HIGH (ref 11.5–15.5)
WBC: 13.6 10*3/uL — ABNORMAL HIGH (ref 4.0–10.5)
WBC: 12.5 10*3/uL — ABNORMAL HIGH (ref 4.0–10.5)
nRBC: 0.3 % — ABNORMAL HIGH (ref 0.0–0.2)
nRBC: 0.5 % — ABNORMAL HIGH (ref 0.0–0.2)

## 2019-11-06 LAB — CBC
HCT: 20.7 % — ABNORMAL LOW (ref 36.0–46.0)
Hemoglobin: 7.2 g/dL — ABNORMAL LOW (ref 12.0–15.0)
MCH: 30.4 pg (ref 26.0–34.0)
MCHC: 34.8 g/dL (ref 30.0–36.0)
MCV: 87.3 fL (ref 80.0–100.0)
Platelets: 66 10*3/uL — ABNORMAL LOW (ref 150–400)
RBC: 2.37 MIL/uL — ABNORMAL LOW (ref 3.87–5.11)
RDW: 16 % — ABNORMAL HIGH (ref 11.5–15.5)
WBC: 12.9 10*3/uL — ABNORMAL HIGH (ref 4.0–10.5)
nRBC: 0.5 % — ABNORMAL HIGH (ref 0.0–0.2)

## 2019-11-06 LAB — COMPREHENSIVE METABOLIC PANEL
ALT: 10 U/L (ref 0–44)
AST: 40 U/L (ref 15–41)
Albumin: 2 g/dL — ABNORMAL LOW (ref 3.5–5.0)
Alkaline Phosphatase: 59 U/L (ref 38–126)
Anion gap: 10 (ref 5–15)
BUN: 12 mg/dL (ref 6–20)
CO2: 21 mmol/L — ABNORMAL LOW (ref 22–32)
Calcium: 7.4 mg/dL — ABNORMAL LOW (ref 8.9–10.3)
Chloride: 109 mmol/L (ref 98–111)
Creatinine, Ser: 0.91 mg/dL (ref 0.44–1.00)
GFR calc non Af Amer: >60 mL/min (ref >60)
Glucose, Bld: 85 mg/dL (ref 70–99)
Potassium: 3.7 mmol/L (ref 3.5–5.1)
Sodium: 140 mmol/L (ref 135–145)
Total Bilirubin: 0.6 mg/dL (ref 0.3–1.2)
Total Protein: 4.3 g/dL — ABNORMAL LOW (ref 6.5–8.1)

## 2019-11-06 LAB — PREPARE FRESH FROZEN PLASMA
Unit division: 0
Unit division: 0
Unit division: 0
Unit division: 0

## 2019-11-06 LAB — RETICULOCYTES
Immature Retic Fract: 43.4 % — ABNORMAL HIGH (ref 2.3–15.9)
RBC.: 2.31 MIL/uL — ABNORMAL LOW (ref 3.87–5.11)
Retic Count, Absolute: 60.1 10*3/uL (ref 19.0–186.0)
Retic Ct Pct: 2.6 % (ref 0.4–3.1)

## 2019-11-06 LAB — PREPARE RBC (CROSSMATCH)

## 2019-11-06 LAB — PREPARE PLATELET PHERESIS: Unit division: 0

## 2019-11-06 LAB — PATHOLOGIST SMEAR REVIEW

## 2019-11-06 MED ORDER — SODIUM CHLORIDE 0.9% IV SOLUTION: Freq: Once | INTRAVENOUS | Status: DC

## 2019-11-06 MED ORDER — SODIUM CHLORIDE 0.9% IV SOLUTION: Freq: Once | INTRAVENOUS | Status: AC

## 2019-11-06 NOTE — Progress Notes (Signed)
RN assessed pressure assisted device at right groin upon start of shift. Device was intact. Wound visualized- no redness or bleeding. Pt. reports no pain at site.

## 2019-11-06 NOTE — Progress Notes (Signed)
Spoke to Dr. Logan Bores on telephone who state he consulted with hematology who visited earlier. Patient;s labs are stable including fibrinogin, coagulation labs, platelets in 60s, potassium, and creatinine. No additional doses of FFO or Cryro at this time. It was stated that they agreed for one additional unit of PRBCs. Awaiting new orders and update notes.

## 2019-11-06 NOTE — Lactation Note (Signed)
Lactation Consultation Note  Patient Name: Kathleen Barnes [id number] Date: 11/06/2019   Lactation support delayed for G1P1 mom who delivered SVD at [redacted]w[redacted]d 2 days ago due to hemorrhage and surgery. Patient is stable at this time and desires to initiate milk production via use of DEBP.  LC provided DEBP to LDR2, educated patient, patient's mom, and significant other on set-up, use, cleaning, milk storage/thawing, and paced bottle feeding. Encouraged pumping every 2-3 hours, breast massage/compression while pumping and hand expression pre/post pumping for optimal results. Mom encouraged to spend time when possible skin to skin with baby, and efforts for attempted latching as desired, and provided information for calling for lactation support.  L&D RN updated. Lactation contact information left.   Interventions    Set-up of DEBP; instructed in use and frequency  Lactation Tools Discussed/Used  Pump    Kathleen Barnes 11/06/2019, 1:57 PM

## 2019-11-06 NOTE — Progress Notes (Signed)
Pressure device removed by PA. RN was instructed that 10 cc of air could have been removed each hour, but since the seal was not intact it could be removed. The site appeared clean, dry, and intact with no signs of infection or drainage. RN placed 4x4 gauze and paper tape dressing per PA. Pedal pulses present.

## 2019-11-06 NOTE — Progress Notes (Signed)
CBC drawn two hours post-blood product administration per policy.  Lab stated that CBC would be resulted shortly. Patient stable at this time.

## 2019-11-06 NOTE — Progress Notes (Signed)
200 cc deflated from [facility] balloon per Dr Logan Bores order

## 2019-11-06 NOTE — Progress Notes (Signed)
Progress Note - Vaginal Delivery  Azra S Crock is a 19 y.o. G1P1001 now PP day 2 s/p Vaginal, Spontaneous .   With major postpartum hemorrhage and significant anemia.  Subjective:  The patient reports she feels better.  She denies pelvic pain.  She seems much more alert and oriented today.   Objective:  Vital signs in last 24 hours: Temp:  [98 F (36.7 C)-99.8 F (37.7 C)] 98.4 F (36.9 C) (10/07 1150) Pulse Rate:  [89-119] 104 (10/07 1150) Resp:  [10-28] 16 (10/07 1150) BP: (110-147)/(65-104) 117/79 (10/07 1150) SpO2:  [95 %-100 %] 98 % (10/07 1150)  Physical Exam:  General: alert, cooperative and no distress   Significant peripheral and facial edema noted. Bakri balloon emptied of 250 mL -vaginal packing and balloon removed Uterine Fundus: firm Abdomen is soft nontender no rebound or guarding Minimal bleeding after balloon removal.    Data Review Recent Labs    11/05/19 2207 11/06/19 0356  HGB 6.3* 5.6*  HCT 17.9* 15.3*    Assessment/Plan: Active Problems:   Normal labor   PPH (postpartum hemorrhage) Acute blood loss anemia  Patient's bleeding has now become normal for postpartum bleeding.  After Bakri balloon removal bleeding still remains good. Patient remains symptomatically and objectively anemic.  She has begun third spacing fluids. She has received 4 units of blood so far today and I will add some fresh frozen plasma. We will continue to follow symptoms and lab work and determine further transfusions as necessary. Follow vaginal bleeding Follow urine output and expect diuresis as blood count improves without further significant vaginal bleeding.    Elonda Husky, M.D. 11/06/2019 12:33 PM

## 2019-11-06 NOTE — Progress Notes (Signed)
Updated MD on patient status. RN noticed reddening in whites of eyes-MD made aware. MD states that hematology has been consulted-awaiting arrival to hospital. Pt alert and oriented x 4 at this time. Pt. Is maintaining urinary output and scant bleeding reporting mild fatigue. Clear bilateral lung sounds. VSS. No new orders at this time.

## 2019-11-06 NOTE — Consult Note (Signed)
[facility]  Date of admission:  11/04/2019  Inpatient day:  11/06/2019  Consulting physician: Dr Jeannie Fend    Reason for Consultation:  Blood products  Chief Complaint: Kathleen Barnes is a 19 y.o. female  currently day 2 s/p vaginal delivery and massive postpartum hemorrhage.   HPI: The patient denies any past medical history.  Approximately 1 - 2 months ago, she developed gestational diabetes.  Her mother notes that for the last week her blood sugar was under control.  She was taking glyburide.  She denies any other new medications or herbal products.  She denies use of aspirin, ibuprofen or Motrin.  She denies any family history of bleeding.  She notes no personal history of bleeding.  She notes that her menses is typically 4 days - 1 week.  At most, she may use 5 pads a day.  She never becomes dizzy or short of breath with her menses.  She denies easy bruisability.  She has had no surgeries or dental extractions.  She notes that this is her first pregnancy.  Pregnancy was uncomplicated except for gestational diabetes.  On presentation she was 37 weeks.  She began having contractions.  She underwent spontaneous vaginal delivery on 11/04/2019 around 8:06 PM.  Estimated blood loss was approximately 150 cc.  Placenta was delivered without problems.  There were no lacerations.  At approximately 20:11 on 11/04/2019, she had noticeable increased vaginal bleeding.  She was passing large clots.  She received rectal misoprostol and fundal massage.  She then developed significant postpartum bleeding of approximately 650 cc at 12:20 AM on 11/05/2019.  A bakri balloon was placed for uterine tamponade.  There were no tears.  She was passing large clots.  Curettage was performed.  There were no retained products.  She was noted to have a firm uterus with good tone.  Massive transfusion protocol was initiated.  As she went to the operating room at 8:02 AM on 11/05/2019 for vaginal  exploration and curettage.  She underwent replacement of bakari balloon.  There was repair of a superficial vulvar tear.  Estimated blood loss was 120 cc.  Because of ongoing bleeding, vascular surgery was consulted.  She underwent embolization of the left and right uterine artery on 11/05/2019 at 14:20.  Pitocin was stopped on 11/05/2019 at 19:49.   Review of blood products released per the blood bank include 8 units of packed red blood cells to date.  2 units were released on 11/04/2019 at 23: 27, 4 units on 11/05/2019 and 2 units on 11/06/2019 at 4:54 AM.  She received 6 units of FFP.  4 units were released on 11/05/2019, early in the day, 1 unit at 18:05, and 1 unit at 9:24 on 11/06/2019.  One unit of platelets was released at 5:44 on 11/05/2019.  Labs have been followed: 05/16/2019   -----:   Hematocrit 37.6.  Hemoglobin 12.8.  MCV 83.  Platelets 226,000 09/11/2019   -----:   Hematocrit 33.2.  Hemoglobin 11.1.  MCV 85.  Platelets 220,000. 11/04/2019   9:32:  Hematocrit 31.1.  Hemoglobin 10.3.                  Platelets 164,000.  Albumen 2.6.  LFTs normal. 11/04/2019 22:16:  Hematocrit 21.0.  Hemoglobin   7.3. 11/05/2019   8:28:  Hematocrit 19.9.  Hemoglobin   6.9.                  Platelets 127,000.  PT  13.9.  INR 1.1.  PTT 30.  Fibrinogen 313.  D-dimer 2481.22 11/05/2019 14:16:  Hematocrit 24.6.  Hemoglobin   9.0.  MCV 87.  Platelets 116,000. 11/05/2019 22:07:  Hematocrit 17.9.  Hemoglobin   6.3.                  Platelets   89,000. 11/06/2019 03:56:  Hematocrit 15.3.  Hemoglobin   5.6.                  Platelets   79,000. 11/06/2019 13:18:  Hematocrit 20.9.  Hemoglobin   7.3.                  Platelets   69,000. 11/06/2019 19:28:  Hematocrit 20.7.  Hemoglobin   7.2.  MCV 87.  Platelets   66,000.  PT 12.2.  INR 0.9.  PTT 27.  Fibrinogen 420.  D-dimers 420.  Today is postpartum day 2.  Bakri balloon was removed approximately 5 AM per the patient.  Nursing notes that her vaginal bleeding is now  "normal".  The patient notes that her bleeding is like "little drops".  She is feeling much better.  Nursing notes that her urine output is good.  Her edema is somewhat better.  She denies any shortness of breath, chest pain or cough.  When inquiring about her scleral hemorrhages, her mother notes that those occurred during labor when she was pushing.  She denies any nosebleeds, bleeds, blood in the urine or stool.   Past Medical History:  Diagnosis Date  . Gestational diabetes   . History of chlamydia infection 10/16/2017    Past Surgical History:  Procedure Laterality Date  . ABDOMINAL HYSTERECTOMY N/A 11/05/2019   Procedure: exam under anesthesia, bakri placement, endometrial curretage;  Surgeon: Harlin Heys, MD;  Location: [facility] ORS;  Service: Gynecology;  Laterality: N/A;  . EMBOLIZATION N/A 11/05/2019   Procedure: EMBOLIZATION;  Surgeon: Algernon Huxley, MD;  Location: [place] [facility];  Service: Cardiovascular;  Laterality: N/A;  . no surgical history      Family History  Problem Relation Age of Onset  . Hypertension Mother   . Healthy Father   . Heart attack Maternal Grandmother   . Migraines Maternal Grandmother     Social History:  reports that she has never smoked. She has never used smokeless tobacco. She reports that she does not drink alcohol and does not use drugs.  The patient denies any exposure to radiation or toxins.  He works as a Engineer, civil (consulting).  The patient lives in [place].  She is accompanied by her mother, boyfriend and baby girl, Kathleen Barnes.  Allergies: Not on File  Medications Prior to Admission  Medication Sig Dispense Refill  . Blood Glucose Monitoring Suppl (ACCU-CHEK NANO SMARTVIEW) w/Device KIT 1 kit by Subdermal route as directed. Check blood sugars for fasting, and two hours after breakfast, lunch and dinner (4 checks daily) 1 kit 0  . glucose blood (ACCU-CHEK SMARTVIEW) test strip Use as instructed to check blood sugars 100 each 12  . glyBURIDE  (DIABETA) 5 MG tablet Take 1.5 tablets (7.5 mg total) by mouth 2 (two) times daily with a meal. 60 tablet 4  . Prenatal Vit-Fe Fumarate-FA (MULTIVITAMIN-PRENATAL) 27-0.8 MG TABS tablet Take 1 tablet by mouth daily at 12 noon.    . insulin NPH Human (NOVOLIN N) 100 UNIT/ML injection Inject 0.34 mLs (34 Units total) into the skin daily before breakfast. (Patient not taking: Reported on 10/30/2019) 10 mL  1  . insulin NPH Human (NOVOLIN N) 100 UNIT/ML injection Inject 0.13 mLs (13 Units total) into the skin at bedtime. (Patient not taking: Reported on 10/30/2019) 10 mL 1  . insulin regular (HUMULIN R) 100 units/mL injection Inject 0.17 mLs (17 Units total) into the skin daily before breakfast. (Patient not taking: Reported on 10/30/2019) 10 mL 1  . insulin regular (HUMULIN R) 100 units/mL injection Inject 0.13 mLs (13 Units total) into the skin daily before supper. (Patient not taking: Reported on 10/30/2019) 10 mL 1  . Needle, Disp, 32G X 5/16" MISC Please dispense needle for insulin use. (Patient not taking: Reported on 11/04/2019) 100 each 5  . Syringe, Disposable, 1 ML MISC Please dispense syringe to use for insulin (Patient not taking: Reported on 11/04/2019) 100 each 5    Review of Systems: GENERAL:  Feels "good".  No fevers, sweats or weight loss. PERFORMANCE STATUS (ECOG): 1-2. HEENT: Sore throat s/p procedure no visual changes, runny nose, mouth sores or tenderness. Lungs: No shortness of breath or cough.  No hemoptysis. Cardiac:  No chest pain, palpitations, orthopnea, or PND. GI: Eating of food today.  No nausea, vomiting, diarrhea, constipation, melena or hematochezia. GU:  No urgency, frequency, dysuria, or hematuria.  Foley catheter in place. Musculoskeletal:  No back pain.  No joint pain.  No muscle tenderness. Extremities:  Lower extremity swelling.  No pain. Skin:  No rashes or skin changes. Neuro:  No headache, numbness or weakness, balance or coordination issues. Endocrine:   Gestational diabetes.  No thyroid issues, hot flashes or night sweats. Psych:  No mood changes, depression or anxiety. Pain:  No focal pain. Review of systems:  All other systems reviewed and found to be negative.  Physical Exam:  Blood pressure 127/78, pulse 92, temperature 98.7 F (37.1 C), temperature source Oral, resp. rate 16, height 5' (1.524 m), weight 187 lb 6.3 oz (85 kg), last menstrual period 02/18/2019, SpO2 99 %, unknown if currently breastfeeding.  GENERAL:  Well developed, well nourished, young woman sitting up comfortably in bed on the labor and delivery unit in no acute distress. MENTAL STATUS:  Alert and oriented to person, place and time. HEAD:  Brown hair in braids.  Normocephalic, atraumatic, face full and symmetric, no Cushingoid features. EYES:  Brown eyes.  Pupils equal round and reactive to light and accomodation.  Bilateral scleral hemorrhages.  No conjunctivitis or scleral icterus. ENT:  Oropharynx clear without lesion.  Tongue normal. Mucous membranes moist.  RESPIRATORY:  Clear to auscultation anteriorly without rales, wheezes or rhonchi. CARDIOVASCULAR:  Regular rate and rhythm without murmur, rub or gallop. ABDOMEN:  Soft, non-tender, with active bowel sounds, and no hepatosplenomegaly.  No masses. SKIN:  No rashes, ulcers or lesions. EXTREMITIES:  ICDs in place.  Bilateral 2+ lower extremity edema.  No skin discoloration or tenderness.  No palpable cords. LYMPH NODES: No palpable cervical, supraclavicular, axillary or inguinal adenopathy  NEUROLOGICAL: Unremarkable. PSYCH:  Appropriate.   Results for orders placed or performed during the hospital encounter of 11/04/19 (from the past 48 hour(s))  Prepare RBC (crossmatch)     Status: None   Collection Time: 11/04/19  9:58 PM  Result Value Ref Range   Order Confirmation      ORDER PROCESSED BY BLOOD BANK Performed at [facility], [address] [place], [place] [address]   Hemoglobin and  hematocrit, blood     Status: Abnormal   Collection Time: 11/04/19 10:16 PM  Result Value Ref Range  Hemoglobin 7.3 (L) 12.0 - 15.0 g/dL    Comment: REPEATED TO VERIFY   HCT 21.0 (L) 36 - 46 %    Comment: Performed at [facility], [address] [place], [place] [address]  Initiate MTP (Blood Bank Notification)     Status: None   Collection Time: 11/04/19 11:22 PM  Result Value Ref Range   Initiate Massive Transfusion Protocol      MTP ORDER RECEIVED MTP STARTED 11/04/19 AT 2322 MTP ENDED 11/05/19 AT 0746 Performed at [facility], [address], [place], [place] [address]   Prepare fresh frozen plasma     Status: None   Collection Time: 11/04/19 11:22 PM  Result Value Ref Range   Unit Number [id number]    Blood Component Type THAWED PLASMA    Unit division 00    Status of Unit ISSUED,FINAL    Transfusion Status OK TO TRANSFUSE    Unit Number [id number]    Blood Component Type THAWED PLASMA    Unit division 00    Status of Unit ISSUED,FINAL    Transfusion Status OK TO TRANSFUSE    Unit Number [id number]    Blood Component Type THAWED PLASMA    Unit division 00    Status of Unit ISSUED,FINAL    Transfusion Status OK TO TRANSFUSE    Unit Number [id number]    Blood Component Type THAWED PLASMA    Unit division 00    Status of Unit ISSUED,FINAL    Transfusion Status      OK TO TRANSFUSE Performed at [facility], [address] [place], [place] [address]   DIC Panel [facility] Only) now then every 30 minutes     Status: Abnormal   Collection Time: 11/05/19  3:46 AM  Result Value Ref Range   Prothrombin Time 14.0 11.4 - 15.2 seconds   INR 1.1 0.8 - 1.2    Comment: (NOTE) INR goal varies based on device and disease states.    aPTT 29 24 - 36 seconds   Fibrinogen 350 210 - 475 mg/dL   Fibrin derivatives D-dimer ([facility]) 4,533.42 (H) 0.00 - 499.00 ng/mL (FEU)    Comment: (NOTE) <> Exclusion of Venous Thromboembolism (VTE) - OUTPATIENT  ONLY   (Emergency Department or [place])    0-499 ng/ml (FEU): With a low to intermediate pretest probability                      for VTE this test result excludes the diagnosis                      of VTE.   >499 ng/ml (FEU) : VTE not excluded; additional work up for VTE is                      required.  <> Testing on Inpatients and Evaluation of Disseminated Intravascular   Coagulation (DIC) Reference Range:   0-499 ng/ml (FEU)    Platelets 134 (L) 150 - 400 K/uL    Comment: Immature Platelet Fraction may be clinically indicated, consider ordering this additional test [id number]    Smear Review NO SCHISTOCYTES SEEN     Comment: Performed at [facility], [address] [place], [place] [address]  Hemoglobin and hematocrit, blood (STAT)     Status: Abnormal   Collection Time: 11/05/19  3:46 AM  Result Value Ref Range   Hemoglobin 10.6 (L) 12.0 - 15.0 g/dL  Comment: POST TRANSFUSION SPECIMEN   HCT 30.7 (L) 36 - 46 %    Comment: Performed at [facility], [address] [place], [place] [address]  Prepare platelet pheresis     Status: None   Collection Time: 11/05/19  5:36 AM  Result Value Ref Range   Unit Number [id number]    Blood Component Type PLTP2 PSORALEN TREATED    Unit division 00    Status of Unit ISSUED,FINAL    Transfusion Status      OK TO TRANSFUSE Performed at [facility], [address] [place], [place] [address]   Initiate postpartum hemorrage protocol - BB notification     Status: None   Collection Time: 11/05/19  8:23 AM  Result Value Ref Range   Initiate PPH protocol (BB notified)      MTP ORDER RECEIVED MTP DISCONTINUED AT 1001 11/05/19 BY [facility] DAS Performed at [facility], [address] [place], [place] [address]   CBC     Status: Abnormal   Collection Time: 11/05/19  8:28 AM  Result Value Ref Range   WBC 16.5 (H) 4.0 - 10.5 K/uL   RBC 2.33 (L) 3.87 - 5.11 MIL/uL   Hemoglobin 6.9 (L) 12.0 -  15.0 g/dL    Comment: REPEATED TO VERIFY   HCT 19.9 (L) 36 - 46 %   MCV 85.4 80.0 - 100.0 fL   MCH 29.6 26.0 - 34.0 pg   MCHC 34.7 30.0 - 36.0 g/dL   RDW 15.0 11.5 - 15.5 %   Platelets 127 (L) 150 - 400 K/uL    Comment: Immature Platelet Fraction may be clinically indicated, consider ordering this additional test [id number]    nRBC 0.1 0.0 - 0.2 %    Comment: Performed at [facility], [address] [place], [place] [address]  DIC Panel ([place] Only) ONCE - STAT     Status: Abnormal   Collection Time: 11/05/19  8:28 AM  Result Value Ref Range   Prothrombin Time 13.9 11.4 - 15.2 seconds   INR 1.1 0.8 - 1.2    Comment: (NOTE) INR goal varies based on device and disease states.    aPTT 30 24 - 36 seconds   Fibrinogen 313 210 - 475 mg/dL   Fibrin derivatives D-dimer ([facility]) 2,481.22 (H) 0.00 - 499.00 ng/mL (FEU)    Comment: (NOTE) <> Exclusion of Venous Thromboembolism (VTE) - OUTPATIENT ONLY   (Emergency Department or [place])    0-499 ng/ml (FEU): With a low to intermediate pretest probability                      for VTE this test result excludes the diagnosis                      of VTE.   >499 ng/ml (FEU) : VTE not excluded; additional work up for VTE is                      required.  <> Testing on Inpatients and Evaluation of Disseminated Intravascular   Coagulation (DIC) Reference Range:   0-499 ng/ml (FEU)    Platelets 117 (L) 150 - 400 K/uL    Comment: Immature Platelet Fraction may be clinically indicated, consider ordering this additional test [id number]    Smear Review NO SCHISTOCYTES SEEN     Comment: Performed at [facility], [address] [place], [place] [address]  Prepare RBC (crossmatch)     Status: None   Collection Time: 11/05/19  9:42 AM  Result Value Ref Range   Order Confirmation      DUPLICATE REQUEST PER [facility] APEL AT 5176 11/05/19 DAS Performed at [facility], [address] [place], [place] [address]   Prepare  fresh frozen plasma     Status: None   Collection Time: 11/05/19  9:44 AM  Result Value Ref Range   Unit Number [id number]    Blood Component Type THW PLS APHR    Unit division B0    Status of Unit ISSUED,FINAL    Transfusion Status      OK TO TRANSFUSE Performed at [facility], [address] [place], [place] [address]   CBC     Status: Abnormal   Collection Time: 11/05/19  2:16 PM  Result Value Ref Range   WBC 19.5 (H) 4.0 - 10.5 K/uL   RBC 2.82 (L) 3.87 - 5.11 MIL/uL   Hemoglobin 9.0 (L) 12.0 - 15.0 g/dL    Comment: POST TRANSFUSION SPECIMEN   HCT 24.6 (L) 36 - 46 %   MCV 87.2 80.0 - 100.0 fL   MCH 31.9 26.0 - 34.0 pg   MCHC 36.6 (H) 30.0 - 36.0 g/dL   RDW 15.5 11.5 - 15.5 %   Platelets 116 (L) 150 - 400 K/uL    Comment: Immature Platelet Fraction may be clinically indicated, consider ordering this additional test [id number]    nRBC 0.3 (H) 0.0 - 0.2 %    Comment: Performed at [facility], [address] [place], [place] [address]  CBC with Differential     Status: Abnormal   Collection Time: 11/05/19 10:07 PM  Result Value Ref Range   WBC 16.5 (H) 4.0 - 10.5 K/uL   RBC 2.01 (L) 3.87 - 5.11 MIL/uL   Hemoglobin 6.3 (L) 12.0 - 15.0 g/dL   HCT 17.9 (L) 36 - 46 %   MCV 89.1 80.0 - 100.0 fL   MCH 31.3 26.0 - 34.0 pg   MCHC 35.2 30.0 - 36.0 g/dL   RDW 14.8 11.5 - 15.5 %   Platelets 89 (L) 150 - 400 K/uL    Comment: Immature Platelet Fraction may be clinically indicated, consider ordering this additional test [id number]    nRBC 0.2 0.0 - 0.2 %   Neutrophils Relative % 77 %   Neutro Abs 12.7 (H) 1.7 - 7.7 K/uL   Lymphocytes Relative 12 %   Lymphs Abs 1.9 0.7 - 4.0 K/uL   Monocytes Relative 11 %   Monocytes Absolute 1.8 (H) 0.1 - 1.0 K/uL   Eosinophils Relative 0 %   Eosinophils Absolute 0.0 0 - 0 K/uL   Basophils Relative 0 %   Basophils Absolute 0.0 0 - 0 K/uL   Immature Granulocytes 0 %   Abs Immature Granulocytes 0.07 0.00 - 0.07 K/uL     Comment: Performed at [facility], [address] [place], [place] [address]  Prepare RBC (crossmatch)     Status: None   Collection Time: 11/05/19 11:15 PM  Result Value Ref Range   Order Confirmation      ORDER PROCESSED BY BLOOD BANK Performed at [facility], [address], [place], [place] [address]   Prepare Pheresed Platelets     Status: None (Preliminary result)   Collection Time: 11/05/19 11:17 PM  Result Value Ref Range   Unit Number [id number]    Blood Component Type  PLTP2 PSORALEN TREATED    Unit division 00    Status of Unit ALLOCATED    Transfusion Status OK TO TRANSFUSE   CBC with Differential     Status: Abnormal   Collection Time: 11/06/19  3:56 AM  Result Value Ref Range   WBC 13.6 (H) 4.0 - 10.5 K/uL   RBC 1.76 (L) 3.87 - 5.11 MIL/uL   Hemoglobin 5.6 (L) 12.0 - 15.0 g/dL   HCT 15.3 (L) 36 - 46 %   MCV 86.9 80.0 - 100.0 fL   MCH 31.8 26.0 - 34.0 pg   MCHC 36.6 (H) 30.0 - 36.0 g/dL   RDW 15.1 11.5 - 15.5 %   Platelets 79 (L) 150 - 400 K/uL    Comment: Immature Platelet Fraction may be clinically indicated, consider ordering this additional test [id number] CONSISTENT WITH PREVIOUS RESULT    nRBC 0.3 (H) 0.0 - 0.2 %   Neutrophils Relative % 73 %   Neutro Abs 9.8 (H) 1.7 - 7.7 K/uL   Lymphocytes Relative 15 %   Lymphs Abs 2.1 0.7 - 4.0 K/uL   Monocytes Relative 11 %   Monocytes Absolute 1.5 (H) 0.1 - 1.0 K/uL   Eosinophils Relative 0 %   Eosinophils Absolute 0.0 0 - 0 K/uL   Basophils Relative 0 %   Basophils Absolute 0.0 0 - 0 K/uL   Immature Granulocytes 1 %   Abs Immature Granulocytes 0.13 (H) 0.00 - 0.07 K/uL    Comment: Performed at [facility], [address] [place], [place] [address]  Prepare RBC (crossmatch)     Status: None   Collection Time: 11/06/19  5:07 AM  Result Value Ref Range   Order Confirmation      ORDER PROCESSED BY BLOOD BANK Performed at [facility], [address]  [place], [place] [address]   Prepare fresh frozen plasma     Status: None (Preliminary result)   Collection Time: 11/06/19  8:31 AM  Result Value Ref Range   Unit Number [id number]    Blood Component Type THW PLS APHR    Unit division B0    Status of Unit ISSUED    Transfusion Status      OK TO TRANSFUSE Performed at [facility], [address] [place], [place] [address]   CBC with Differential/Platelet     Status: Abnormal   Collection Time: 11/06/19  1:18 PM  Result Value Ref Range   WBC 12.5 (H) 4.0 - 10.5 K/uL   RBC 2.38 (L) 3.87 - 5.11 MIL/uL   Hemoglobin 7.3 (L) 12.0 - 15.0 g/dL    Comment: REPEATED TO VERIFY   HCT 20.9 (L) 36 - 46 %   MCV 87.8 80.0 - 100.0 fL   MCH 30.7 26.0 - 34.0 pg   MCHC 34.9 30.0 - 36.0 g/dL   RDW 16.3 (H) 11.5 - 15.5 %   Platelets 69 (L) 150 - 400 K/uL    Comment: Immature Platelet Fraction may be clinically indicated, consider ordering this additional test [id number]    nRBC 0.5 (H) 0.0 - 0.2 %   Neutrophils Relative % 71 %   Neutro Abs 8.8 (H) 1.7 - 7.7 K/uL   Lymphocytes Relative 14 %   Lymphs Abs 1.7 0.7 - 4.0 K/uL   Monocytes Relative 11 %   Monocytes Absolute 1.4 (H) 0.1 - 1.0 K/uL   Eosinophils Relative 0 %   Eosinophils Absolute 0.0 0 - 0 K/uL   Basophils Relative  0 %   Basophils Absolute 0.0 0 - 0 K/uL   Immature Granulocytes 4 %   Abs Immature Granulocytes 0.53 (H) 0.00 - 0.07 K/uL    Comment: Performed at [facility], [address] [place], [place] [address]  Pathologist smear review     Status: None   Collection Time: 11/06/19  1:18 PM  Result Value Ref Range   Path Review Blood smear is reviewed.     Comment: Patient is two days post partum with postpartum hemorrhage. Has received 4u PRBC. Normocytic anemia with circulating nucleated RBCs. Schistocytes are not identified. Mild leukocytosis with slight left shift in maturity. Thrombocytopenia with unremarkable platelet morphology. Findings secondary to  known post partum hemorrhage and associated physiologic stress. Reviewed by Dellia Nims Reuel Derby, M.D. Performed at [facility], [address] [place], [place] [address]    PERIPHERAL VASCULAR CATHETERIZATION  Result Date: 11/05/2019 See op note   Assessment:  The patient is a 19 y.o. woman currently day 2 s/p delivery with massive postpartum hemorrhage.  She has no history of any bleeding diathesis.  Menses pre-pregnancy was unremarkable.  She denies any use of aspirin, ibuprofen or herbal products.  She has no family history of any bleeding disorders.  She has undergone bakri balloon x2, vaginal exploration, and curettage x2.  She is undergone uterine artery embolization (left and right).    She has received 8 units of PRBCs, 6 units of FFP and 1 unit of platelets.  Excessive bleeding appears to have resolved and is now normal postpartum bleeding per staff.  Peripheral smear reveals normocytic anemia with circulting nucleated RBCs.  Schistocytes not identified.  There was mild leukocytosis with left shift and thrombocytopenia.  Symptomatically, she feels "good". Exam reveals scleral hemorrhages which occurred during labor. She has lower extremity flank and facial edema.  Plan:   1.  Post-partum hemorrhage  Etiology is unclear.     Notes indicate no evidence of diffuse or focal atony.    There was no evidence of trauma with laceration or incisions.   There were no retained products of conception.    History does not suggest an underlying coagulopathy as she had normal menses with no history of excess bruising or bleeding.   She denies any use of aspirin, ibuprofen or herbal products.  Discuss plan for work-up of a bleeding diathesis approximately 2 weeks postpartum given multiple blood products received.  Discuss serial monitoring of CBC and coags.   PT and PTT are normal.  No FFP currently needed.   Maintain fibrinogen > 300.   D-dimers are improving.  Agree with  maintaining a hemoglobin 8-8.5.   Transfuse 1 unit of PRBCs tonight.   Serial CBCs.  Maintain platelets > 50,000 as bleeding in minimal (normal post-partum).  2.  Edema  Patient has edema secondary to significant blood products received, low albumen (2.0) and anemia.  Anticipate that she will slowly mobilize fluids.  Considering checking echo. 3.   Electrolytes  There can be electrolyte issues s/p massive transfusion.  Potassium is normal.  Calcium is 7.4 with an albumen of 2.0 (corrected calcium 9.1).  Continue to monitor.   Thank you for allowing me to participate in Virgil 's care.  I will follow her closely with you while hospitalized and after discharge in the outpatient department.   Lequita Asal, MD  11/06/2019, 7:00 PM

## 2019-11-06 NOTE — Progress Notes (Signed)
Updated MD on hematology consult and plan to get labs. Updated on patient status. MD okay for patient to transfer to floor later if remains stable-depending on hematology orders.

## 2019-11-06 NOTE — Progress Notes (Signed)
[facility] Daily Progress Note   Subjective: 11/05/19 1. Ultrasound guidance for vascular access right femoral artery 2. Catheter placement into left uterine artery and right uterine artery from right femoral approach 3. Aortogram and selective pelvic arteriograms including selective imaging of both uterine arteries 4. Micro-bead embolization with 5 cc of 700-900  polyvinyl alcohol beads to the left uterine artery and a total of 3 Ruby coils 5. Micro-bead embolization with 5 cc of 700 900  polyvinyl alcohol beads to the right uterine artery and a total of 4 Ruby coils             6. StarClose closure device right femoral artery  Patient without complaint this AM.  No acute issues overnight.  Objective: Vitals:   11/06/19 0750 11/06/19 0912 11/06/19 0955 11/06/19 1016  BP:  119/75    Pulse: (!) 112 (!) 109 (!) 118   Resp: 16 20 18    Temp: 98.1 F (36.7 C) 98.7 F (37.1 C) 99.1 F (37.3 C) 98.7 F (37.1 C)  TempSrc: Oral Oral Oral Oral  SpO2:  96%    Weight:      Height:        Intake/Output Summary (Last 24 hours) at 11/06/2019 1102 Last data filed at 11/06/2019 1055 Gross per 24 hour  Intake 3118.01 ml  Output 3025 ml  Net 93.01 ml   Physical Exam: A&Ox3, NAD CV: RRR Pulmonary: CTA Bilaterally Abdomen: Soft, Nontender, Nondistended Right Groin:  Access Site: PAD removed.  Access site clean dry and intact.  No hematoma. Vascular: Extremities are warm distally   Laboratory: CBC    Component Value Date/Time   WBC 13.6 (H) 11/06/2019 0356   HGB 5.6 (L) 11/06/2019 0356   HGB 11.1 09/11/2019 0923   HCT 15.3 (L) 11/06/2019 0356   HCT 33.2 (L) 09/11/2019 0923   PLT 79 (L) 11/06/2019 0356   PLT 220 09/11/2019 0923   BMET No results found for: NA, K, CL, CO2, GLUCOSE, BUN, CREATININE, CALCIUM, GFRNONAA, GFRAA  Assessment/Planning: The patient is a 19 year old female status post  uterine embolization - POD#1  1) s/p uterine embolization in setting of postpartum hemorrhage 2) at this time, unable to offer any further endovascular intervention. If continued bleeding is noted may consider hysterectomy. 3) vascular surgery will sign off at this time.  No need for outpatient follow-up.  Discussed with Dr. 26 Faustina Gebert PA-C 11/06/2019 11:02 AM

## 2019-11-07 LAB — BASIC METABOLIC PANEL
Anion gap: 9 (ref 5–15)
BUN: 11 mg/dL (ref 6–20)
CO2: 21 mmol/L — ABNORMAL LOW (ref 22–32)
Calcium: 7.6 mg/dL — ABNORMAL LOW (ref 8.9–10.3)
Chloride: 108 mmol/L (ref 98–111)
Creatinine, Ser: 0.79 mg/dL (ref 0.44–1.00)
GFR calc non Af Amer: >60 mL/min (ref >60)
Glucose, Bld: 116 mg/dL — ABNORMAL HIGH (ref 70–99)
Potassium: 3.5 mmol/L (ref 3.5–5.1)
Sodium: 138 mmol/L (ref 135–145)

## 2019-11-07 LAB — CBC WITH DIFFERENTIAL/PLATELET
Abs Immature Granulocytes: 0.42 10*3/uL — ABNORMAL HIGH (ref 0.00–0.07)
Basophils Absolute: 0 10*3/uL (ref 0.0–0.1)
Basophils Relative: 0 %
Eosinophils Absolute: 0 10*3/uL (ref 0.0–0.5)
Eosinophils Relative: 0 %
HCT: 21 % — ABNORMAL LOW (ref 36.0–46.0)
Hemoglobin: 7.4 g/dL — ABNORMAL LOW (ref 12.0–15.0)
Immature Granulocytes: 3 %
Lymphocytes Relative: 11 %
Lymphs Abs: 1.3 10*3/uL (ref 0.7–4.0)
MCH: 31.2 pg (ref 26.0–34.0)
MCHC: 35.2 g/dL (ref 30.0–36.0)
MCV: 88.6 fL (ref 80.0–100.0)
Monocytes Absolute: 1 10*3/uL (ref 0.1–1.0)
Monocytes Relative: 8 %
Neutro Abs: 9.5 10*3/uL — ABNORMAL HIGH (ref 1.7–7.7)
Neutrophils Relative %: 78 %
Platelets: 72 10*3/uL — ABNORMAL LOW (ref 150–400)
RBC: 2.37 MIL/uL — ABNORMAL LOW (ref 3.87–5.11)
RDW: 15.9 % — ABNORMAL HIGH (ref 11.5–15.5)
WBC: 12.3 10*3/uL — ABNORMAL HIGH (ref 4.0–10.5)
nRBC: 0.5 % — ABNORMAL HIGH (ref 0.0–0.2)

## 2019-11-07 LAB — FOLATE: Folate: 18.9 ng/mL (ref >5.9)

## 2019-11-07 LAB — BPAM FFP
Blood Product Expiration Date: 202110122359
ISSUE DATE / TIME: 202110070924
Unit Type and Rh: 5100

## 2019-11-07 LAB — CBC
HCT: 20.9 % — ABNORMAL LOW (ref 36.0–46.0)
Hemoglobin: 7.2 g/dL — ABNORMAL LOW (ref 12.0–15.0)
MCH: 30.6 pg (ref 26.0–34.0)
MCHC: 34.4 g/dL (ref 30.0–36.0)
MCV: 88.9 fL (ref 80.0–100.0)
Platelets: 69 10*3/uL — ABNORMAL LOW (ref 150–400)
RBC: 2.35 MIL/uL — ABNORMAL LOW (ref 3.87–5.11)
RDW: 15.9 % — ABNORMAL HIGH (ref 11.5–15.5)
WBC: 12.2 10*3/uL — ABNORMAL HIGH (ref 4.0–10.5)
nRBC: 0.7 % — ABNORMAL HIGH (ref 0.0–0.2)

## 2019-11-07 LAB — PROTIME-INR
INR: 1 (ref 0.8–1.2)
Prothrombin Time: 12.5 seconds (ref 11.4–15.2)

## 2019-11-07 LAB — PREPARE FRESH FROZEN PLASMA

## 2019-11-07 LAB — FIBRINOGEN: Fibrinogen: 461 mg/dL (ref 210–475)

## 2019-11-07 LAB — VITAMIN B12: Vitamin B-12: 296 pg/mL (ref 180–914)

## 2019-11-07 LAB — APTT: aPTT: 29 seconds (ref 24–36)

## 2019-11-07 NOTE — Progress Notes (Signed)
Progress Note - Vaginal Delivery  Kathleen Barnes is a 19 y.o. G1P1001 now PP day 3 s/p Vaginal, Spontaneous .   Subjective:  The patient reports she is doing much better today.  She is smiling interactive.  Denies pain or bleeding.  She has been pumping and attempting to breast-feed.   Objective:  Vital signs in last 24 hours: Temp:  [98.4 F (36.9 C)-99.3 F (37.4 C)] 99.2 F (37.3 C) (10/08 0738) Pulse Rate:  [78-114] 78 (10/08 0738) Resp:  [16-18] 18 (10/08 0738) BP: (111-135)/(65-87) 135/87 (10/08 0738) SpO2:  [95 %-100 %] 100 % (10/08 0741)  Physical Exam:  General: alert, cooperative and no distress Lochia: appropriate     minimal vaginal bleeding  uterine Fundus: firm Continues to have labial swelling-no firm hard areas-improved from yesterday. Excellent urine output as diuresis continues.    Data Review Recent Labs    11/06/19 1928 11/07/19 0608  HGB 7.2* 7.2*  HCT 20.7* 20.9*    Assessment/Plan: Active Problems:   Normal labor   PPH (postpartum hemorrhage)    We will continue to follow CBC.  Expect patient to continue to progress.  Expect labial swelling to resolve. Continue breast-feeding encouraged.  Consider Foley catheter removal.  Discussed management with patient and her mother in detail.  Reviewed most recent findings and discussed continuing current care.  All questions answered.  Kathleen Barnes, M.D. 11/07/2019 10:46 AM

## 2019-11-07 NOTE — Anesthesia Postprocedure Evaluation (Signed)
Anesthesia Post Note  Patient: Kathleen Barnes  Procedure(s) Performed: exam under anesthesia, bakri placement, endometrial curretage (N/A )  Patient location during evaluation: Mother Baby Anesthesia Type: General Level of consciousness: awake and alert Pain management: pain level controlled Vital Signs Assessment: post-procedure vital signs reviewed and stable Respiratory status: spontaneous breathing, nonlabored ventilation, respiratory function stable and patient connected to nasal cannula oxygen Cardiovascular status: blood pressure returned to baseline and stable Postop Assessment: no apparent nausea or vomiting Anesthetic complications: no   No complications documented.   Last Vitals:  Vitals:   11/06/19 1751 11/06/19 1921  BP: 127/78   Pulse: 92   Resp: 16   Temp: 37.1 C   SpO2: 99% 100%    Last Pain:  Vitals:   11/06/19 1800  TempSrc:   PainSc: 0-No pain                 Corinda Gubler

## 2019-11-07 NOTE — Lactation Note (Signed)
Asked to assist mom with breastfeeding baby, baby sleeping on mom's chest, mom states that her mom just fed baby formula after ped visit at 10:00, baby not rooting or interested in feeding at present, pt states she has not pumped her breasts today, with hx of PPH I recommended that she pump her breasts if baby not going to breast at every feeding, every 3 hrs to assist in increasing milk production.  I set up the symphony pump in her room and she pumped in initiation mode.  SHe states she has an electric pump for use at home.

## 2019-11-07 NOTE — Progress Notes (Signed)
[facility] Hematology/Oncology Progress Note  Date of admission: 11/04/2019  Hospital day:  11/07/2019  Chief Complaint: Kathleen Barnes is a 19 y.o. female currently day 3 s/p vaginal delivery and massive postpartum hemorrhage.   Subjective: She is doing well.  She notes minimal bleeding.  She denies any shortness of breath, chest pain, or dizziness.  She feels good.  Social History: The patient is accompanied by her boyfriend, baby, and nurse today.  Allergies: Not on File  Scheduled Medications: . sodium chloride   Intravenous Once  . sodium chloride   Intravenous Once  . sodium chloride   Intravenous Once  . docusate sodium  100 mg Oral BID  . docusate sodium  100 mg Oral BID  . ibuprofen  600 mg Oral Q6H  . ibuprofen  600 mg Oral Q6H  . prenatal multivitamin  1 tablet Oral Q1200  . prenatal multivitamin  1 tablet Oral Q1200  . Tdap  0.5 mL Intramuscular Once  . Tdap  0.5 mL Intramuscular Once    Review of Systems: GENERAL:  Feels "good".  Out of bed earlier today.  No fevers, sweats or weight loss. PERFORMANCE STATUS (ECOG):  1 HEENT:  No visual changes, runny nose, sore throat, mouth sores or tenderness. Lungs: No shortness of breath or cough.  No hemoptysis. Cardiac:  No chest pain, palpitations, orthopnea, or PND. GI:  Eating a normal diet.  No nausea, vomiting, diarrhea, constipation, melena or hematochezia. GU:  No urgency, frequency, dysuria, or hematuria.  Foley catheter in place.  Urine output 100 cc/hr. Musculoskeletal:  No back pain.  No joint pain.  No muscle tenderness. Extremities:  Lower extremity swelling, improving. Skin:  No rashes or skin changes. Neuro:  No headache, numbness or weakness, balance or coordination issues. Endocrine:  Gestational diabetes.  No thyroid issues, hot flashes or night sweats. Psych:  No mood changes, depression or anxiety. Pain:  No focal pain. Review of systems:  All other systems reviewed and found to be  negative.  Physical Exam: Blood pressure (!) 148/82, pulse 70, temperature 99.1 F (37.3 C), temperature source Oral, resp. rate 18, height 5' (1.524 m), weight 187 lb 6.3 oz (85 kg), last menstrual period 02/18/2019, SpO2 99 %, unknown if currently breastfeeding.  GENERAL:  Well developed, well nourished, young woman sitting comfortably in bed on the lab and delivery unit with her baby in her lap.  MENTAL STATUS:  Alert and oriented to person, place and time. HEAD:  Brown hair with braids.  Normocephalic, atraumatic, face full and symmetric, no Cushingoid features. EYES:  Brown eyes with stable scleral hemorrhages.  Pupils equal round and reactive to light and accomodation.  No conjunctivitis or scleral icterus. RESPIRATORY:  Clear to auscultation without rales, wheezes or rhonchi. CARDIOVASCULAR:  Regular rate and rhythm without murmur, rub or gallop. ABDOMEN:  Soft, non-tender, with active bowel sounds, and no hepatosplenomegaly.  No masses. SKIN:  No rashes, ulcers or lesions. EXTREMITIES: ICDs in place.  Bilateral 2+ lower extremity edema, slightly less tight. No skin discoloration or tenderness.  No palpable cords. NEUROLOGICAL: Unremarkable. PSYCH:  Appropriate.   Results for orders placed or performed during the hospital encounter of 11/04/19 (from the past 48 hour(s))  CBC with Differential     Status: Abnormal   Collection Time: 11/06/19  3:56 AM  Result Value Ref Range   WBC 13.6 (H) 4.0 - 10.5 K/uL   RBC 1.76 (L) 3.87 - 5.11 MIL/uL   Hemoglobin 5.6 (  L) 12.0 - 15.0 g/dL   HCT 65.4 (L) 36 - 46 %   MCV 86.9 80.0 - 100.0 fL   MCH 31.8 26.0 - 34.0 pg   MCHC 36.6 (H) 30.0 - 36.0 g/dL   RDW 65.0 35.4 - 65.6 %   Platelets 79 (L) 150 - 400 K/uL    Comment: Immature Platelet Fraction may be clinically indicated, consider ordering this additional test [id number] CONSISTENT WITH PREVIOUS RESULT    nRBC 0.3 (H) 0.0 - 0.2 %   Neutrophils Relative % 73 %   Neutro Abs 9.8 (H) 1.7 -  7.7 K/uL   Lymphocytes Relative 15 %   Lymphs Abs 2.1 0.7 - 4.0 K/uL   Monocytes Relative 11 %   Monocytes Absolute 1.5 (H) 0.1 - 1.0 K/uL   Eosinophils Relative 0 %   Eosinophils Absolute 0.0 0 - 0 K/uL   Basophils Relative 0 %   Basophils Absolute 0.0 0 - 0 K/uL   Immature Granulocytes 1 %   Abs Immature Granulocytes 0.13 (H) 0.00 - 0.07 K/uL    Comment: Performed at [facility], [address] [place], [place] [address]  Prepare RBC (crossmatch)     Status: None   Collection Time: 11/06/19  5:07 AM  Result Value Ref Range   Order Confirmation      ORDER PROCESSED BY BLOOD BANK Performed at [facility], [address] [place], [place] [address]   Prepare fresh frozen plasma     Status: None   Collection Time: 11/06/19  8:31 AM  Result Value Ref Range   Unit Number [id number]    Blood Component Type THW PLS APHR    Unit division B0    Status of Unit ISSUED,FINAL    Transfusion Status      OK TO TRANSFUSE Performed at [facility], [address] [place], [place] [address]   CBC with Differential/Platelet     Status: Abnormal   Collection Time: 11/06/19  1:18 PM  Result Value Ref Range   WBC 12.5 (H) 4.0 - 10.5 K/uL   RBC 2.38 (L) 3.87 - 5.11 MIL/uL   Hemoglobin 7.3 (L) 12.0 - 15.0 g/dL    Comment: REPEATED TO VERIFY   HCT 20.9 (L) 36 - 46 %   MCV 87.8 80.0 - 100.0 fL   MCH 30.7 26.0 - 34.0 pg   MCHC 34.9 30.0 - 36.0 g/dL   RDW 79.3 (H) 90.3 - 00.9 %   Platelets 69 (L) 150 - 400 K/uL    Comment: Immature Platelet Fraction may be clinically indicated, consider ordering this additional test [id number]    nRBC 0.5 (H) 0.0 - 0.2 %   Neutrophils Relative % 71 %   Neutro Abs 8.8 (H) 1.7 - 7.7 K/uL   Lymphocytes Relative 14 %   Lymphs Abs 1.7 0.7 - 4.0 K/uL   Monocytes Relative 11 %   Monocytes Absolute 1.4 (H) 0.1 - 1.0 K/uL   Eosinophils Relative 0 %   Eosinophils Absolute 0.0 0 - 0 K/uL   Basophils Relative 0 %   Basophils  Absolute 0.0 0 - 0 K/uL   Immature Granulocytes 4 %   Abs Immature Granulocytes 0.53 (H) 0.00 - 0.07 K/uL    Comment: Performed at [facility], [address] [place], [place] [address]  Pathologist smear review     Status: None   Collection Time: 11/06/19  1:18 PM  Result Value Ref Range   Path Review  Blood smear is reviewed.     Comment: Patient is two days post partum with postpartum hemorrhage. Has received 4u PRBC. Normocytic anemia with circulating nucleated RBCs. Schistocytes are not identified. Mild leukocytosis with slight left shift in maturity. Thrombocytopenia with unremarkable platelet morphology. Findings secondary to known post partum hemorrhage and associated physiologic stress. Reviewed by Georgiann Cocker Oneita Kras, M.D. Performed at [facility], [address] [place], [place] [address]   CBC     Status: Abnormal   Collection Time: 11/06/19  7:28 PM  Result Value Ref Range   WBC 12.9 (H) 4.0 - 10.5 K/uL   RBC 2.37 (L) 3.87 - 5.11 MIL/uL   Hemoglobin 7.2 (L) 12.0 - 15.0 g/dL   HCT 60.4 (L) 36 - 46 %   MCV 87.3 80.0 - 100.0 fL   MCH 30.4 26.0 - 34.0 pg   MCHC 34.8 30.0 - 36.0 g/dL   RDW 54.0 (H) 98.1 - 19.1 %   Platelets 66 (L) 150 - 400 K/uL    Comment: Immature Platelet Fraction may be clinically indicated, consider ordering this additional test [id number]    nRBC 0.5 (H) 0.0 - 0.2 %    Comment: Performed at [facility], [address], [place], [place] [address]  Comprehensive metabolic panel     Status: Abnormal   Collection Time: 11/06/19  7:28 PM  Result Value Ref Range   Sodium 140 135 - 145 mmol/L   Potassium 3.7 3.5 - 5.1 mmol/L   Chloride 109 98 - 111 mmol/L   CO2 21 (L) 22 - 32 mmol/L   Glucose, Bld 85 70 - 99 mg/dL    Comment: Glucose reference range applies only to samples taken after fasting for at least 8 hours.   BUN 12 6 - 20 mg/dL   Creatinine, Ser 5.78 0.44 - 1.00 mg/dL   Calcium 7.4 (L) 8.9 - 10.3 mg/dL   Total  Protein 4.3 (L) 6.5 - 8.1 g/dL   Albumin 2.0 (L) 3.5 - 5.0 g/dL   AST 40 15 - 41 U/L   ALT 10 0 - 44 U/L   Alkaline Phosphatase 59 38 - 126 U/L   Total Bilirubin 0.6 0.3 - 1.2 mg/dL   GFR calc non Af Amer >60 >60 mL/min   Anion gap 10 5 - 15    Comment: Performed at [facility], [address] [place], [place] [address]  Reticulocytes     Status: Abnormal   Collection Time: 11/06/19  7:28 PM  Result Value Ref Range   Retic Ct Pct 2.6 0.4 - 3.1 %   RBC. 2.31 (L) 3.87 - 5.11 MIL/uL   Retic Count, Absolute 60.1 19.0 - 186.0 K/uL   Immature Retic Fract 43.4 (H) 2.3 - 15.9 %    Comment: Performed at [facility], [address] [place], [place] [address]  DIC Panel [facility] Only)     Status: Abnormal   Collection Time: 11/06/19  7:28 PM  Result Value Ref Range   Prothrombin Time 12.2 11.4 - 15.2 seconds   INR 0.9 0.8 - 1.2    Comment: (NOTE) INR goal varies based on device and disease states.    aPTT 27 24 - 36 seconds   Fibrinogen 420 210 - 475 mg/dL   Fibrin derivatives D-dimer ([facility]) 840.08 (H) 0.00 - 499.00 ng/mL (FEU)    Comment: (NOTE) <> Exclusion of Venous Thromboembolism (VTE) - OUTPATIENT ONLY   (Emergency Department or [place])    0-499  ng/ml (FEU): With a low to intermediate pretest probability                      for VTE this test result excludes the diagnosis                      of VTE.   >499 ng/ml (FEU) : VTE not excluded; additional work up for VTE is                      required.  <> Testing on Inpatients and Evaluation of Disseminated Intravascular   Coagulation (DIC) Reference Range:   0-499 ng/ml (FEU)    Platelets 66 (L) 150 - 400 K/uL    Comment: Immature Platelet Fraction may be clinically indicated, consider ordering this additional test [id number]    Smear Review NO SCHISTOCYTES SEEN     Comment: Performed at [facility], [address] [place], [place] [address]  CBC     Status: Abnormal   Collection Time:  11/07/19  6:08 AM  Result Value Ref Range   WBC 12.2 (H) 4.0 - 10.5 K/uL   RBC 2.35 (L) 3.87 - 5.11 MIL/uL   Hemoglobin 7.2 (L) 12.0 - 15.0 g/dL   HCT 83.4 (L) 36 - 46 %   MCV 88.9 80.0 - 100.0 fL   MCH 30.6 26.0 - 34.0 pg   MCHC 34.4 30.0 - 36.0 g/dL   RDW 19.6 (H) 22.2 - 97.9 %   Platelets 69 (L) 150 - 400 K/uL    Comment: Immature Platelet Fraction may be clinically indicated, consider ordering this additional test [id number]    nRBC 0.7 (H) 0.0 - 0.2 %    Comment: Performed at [facility], [address] [place], [place] [address]  Basic metabolic panel     Status: Abnormal   Collection Time: 11/07/19  6:08 AM  Result Value Ref Range   Sodium 138 135 - 145 mmol/L   Potassium 3.5 3.5 - 5.1 mmol/L   Chloride 108 98 - 111 mmol/L   CO2 21 (L) 22 - 32 mmol/L   Glucose, Bld 116 (H) 70 - 99 mg/dL    Comment: Glucose reference range applies only to samples taken after fasting for at least 8 hours.   BUN 11 6 - 20 mg/dL   Creatinine, Ser 4.48 0.44 - 1.00 mg/dL   Calcium 7.6 (L) 8.9 - 10.3 mg/dL   GFR calc non Af Amer >60 >60 mL/min   Anion gap 9 5 - 15    Comment: Performed at [facility], [address] [place], [place] [address]  Protime-INR     Status: None   Collection Time: 11/07/19  6:08 AM  Result Value Ref Range   Prothrombin Time 12.5 11.4 - 15.2 seconds   INR 1.0 0.8 - 1.2    Comment: (NOTE) INR goal varies based on device and disease states. Performed at [facility], [address] [place], [place] [address]   APTT     Status: None   Collection Time: 11/07/19  6:08 AM  Result Value Ref Range   aPTT 29 24 - 36 seconds    Comment: Performed at [facility], [address] [place], [place] [address]  Fibrinogen     Status: None   Collection Time: 11/07/19  6:08 AM  Result Value Ref Range   Fibrinogen 461 210 - 475 mg/dL  Comment: Performed at [facility], [address] [place], [place] [address]   Folate, serum, performed at [facility] lab     Status: None   Collection Time: 11/07/19  6:08 AM  Result Value Ref Range   Folate 18.9 >5.9 ng/mL    Comment: Performed at [facility], [address] [place], [place] [address]  Vitamin B12     Status: None   Collection Time: 11/07/19  6:08 AM  Result Value Ref Range   Vitamin B-12 296 180 - 914 pg/mL    Comment: (NOTE) This assay is not validated for testing neonatal or myeloproliferative syndrome specimens for Vitamin B12 levels. Performed at [facility], [address] [place], [place] [address]   CBC with Differential/Platelet     Status: Abnormal   Collection Time: 11/07/19 12:01 PM  Result Value Ref Range   WBC 12.3 (H) 4.0 - 10.5 K/uL   RBC 2.37 (L) 3.87 - 5.11 MIL/uL   Hemoglobin 7.4 (L) 12.0 - 15.0 g/dL   HCT 91.421.0 (L) 36 - 46 %   MCV 88.6 80.0 - 100.0 fL   MCH 31.2 26.0 - 34.0 pg   MCHC 35.2 30.0 - 36.0 g/dL   RDW 78.215.9 (H) 95.611.5 - 21.315.5 %   Platelets 72 (L) 150 - 400 K/uL    Comment: Immature Platelet Fraction may be clinically indicated, consider ordering this additional test [id number]    nRBC 0.5 (H) 0.0 - 0.2 %   Neutrophils Relative % 78 %   Neutro Abs 9.5 (H) 1.7 - 7.7 K/uL   Lymphocytes Relative 11 %   Lymphs Abs 1.3 0.7 - 4.0 K/uL   Monocytes Relative 8 %   Monocytes Absolute 1.0 0.1 - 1.0 K/uL   Eosinophils Relative 0 %   Eosinophils Absolute 0.0 0 - 0 K/uL   Basophils Relative 0 %   Basophils Absolute 0.0 0 - 0 K/uL   Immature Granulocytes 3 %   Abs Immature Granulocytes 0.42 (H) 0.00 - 0.07 K/uL    Comment: Performed at [facility], [address] [place], [place] [address]   No results found.  Assessment:  Kathleen Barnes is a 19 y.o. female with currently day 3 s/p delivery with massive postpartum hemorrhage.  She has no history of any bleeding diathesis.  Menses pre-pregnancy was unremarkable.  She denies any use of aspirin, ibuprofen or herbal products.  She has no  family history of any bleeding disorders.  She has undergone bakri balloon x2, vaginal exploration, and curettage x2.  She is undergone uterine artery embolization (left and right).    She has received 9 units of PRBCs, 6 units of FFP and 1 unit of platelets.  Excessive bleeding has resolved; she has minimal postpartum bleeding per patient.  Peripheral smear reveals normocytic anemia with circulting nucleated RBCs.  Schistocytes not identified.  There was mild leukocytosis with left shift and thrombocytopenia.  Symptomatically, she feels "good". Exam reveals stable scleral hemorrhages.  She has improving lower extremity flank and facial edema.  Plan:   1.  Post-partum hemorrhage             Etiology is unclear.                           Notes indicate no evidence of diffuse or focal atony.  There was no evidence of trauma with laceration or incisions.                         There were no retained products of conception.                          History does not suggest an underlying coagulopathy as she had normal menses with no history of excess bruising or bleeding.                         She denies any use of aspirin, ibuprofen or herbal products.             Plan for work-up of a bleeding diathesis approximately 2 weeks postpartum given multiple blood products received.             PT and PTT were normal this morning.                         Fibrinogen was 461.                         D-dimers had improved (4533 to 2481 to 840).             Hemoglobin appears to have stabilized.   7.2 at 06:08 AM and 7.4 today at 12:01 PM.                         Patient received 1 unit PRBCs with Lasix last night.   Check ferritin and iron stores tomorrow.   Discussed with Dr Logan Bores plan to observe as asymptomatic and monitor CBC.             Platelet count slowly improving   66,000 to 69,000 to 72,000.   Ensure platelets > 50,000 as bleeding is minimal (normal  post-partum).   Anticipate daily improvement.  Maintain active type and screen.    2.  B12 deficiency  B12 level was 296 on 11/07/2019.  B12 goal is 400.  Begin B12 1000 mcg po q day.  Folate was 18.9 (normal). 3.   Edema             Patient has edema secondary to significant blood products received, low albumen (2.0) and anemia.             She is mobilizing fluids with good urine output. 4.   Electrolytes             Electrolyte are stable s/p massive transfusion.             Creatinine is 0.79 (improved); potassium 3.5.             Calcium is 7.6 with an albumen of 2.0 today (corrected calcium 9.3). 5.   Disposition  Patient remains hospitalized under close monitoring and serial labs.  She has gotten out of bed today.     Rosey Bath, MD  11/07/2019, 11:54 PM

## 2019-11-08 DIAGNOSIS — D62 Acute posthemorrhagic anemia: Secondary | ICD-10-CM

## 2019-11-08 HISTORY — DX: Acute posthemorrhagic anemia: D62

## 2019-11-08 LAB — TYPE AND SCREEN
ABO/RH(D): O POS
ABO/RH(D): O POS
Antibody Screen: NEGATIVE
Antibody Screen: NEGATIVE
Unit division: 0
Unit division: 0
Unit division: 0
Unit division: 0
Unit division: 0
Unit division: 0
Unit division: 0
Unit division: 0
Unit division: 0
Unit division: 0
Unit division: 0
Unit division: 0

## 2019-11-08 LAB — CBC
HCT: 19.3 % — ABNORMAL LOW (ref 36.0–46.0)
HCT: 21.4 % — ABNORMAL LOW (ref 36.0–46.0)
Hemoglobin: 6.9 g/dL — ABNORMAL LOW (ref 12.0–15.0)
Hemoglobin: 7.4 g/dL — ABNORMAL LOW (ref 12.0–15.0)
MCH: 31.9 pg (ref 26.0–34.0)
MCH: 31.1 pg (ref 26.0–34.0)
MCHC: 35.8 g/dL (ref 30.0–36.0)
MCHC: 34.6 g/dL (ref 30.0–36.0)
MCV: 89.4 fL (ref 80.0–100.0)
MCV: 89.9 fL (ref 80.0–100.0)
Platelets: 83 10*3/uL — ABNORMAL LOW (ref 150–400)
Platelets: 112 10*3/uL — ABNORMAL LOW (ref 150–400)
RBC: 2.16 MIL/uL — ABNORMAL LOW (ref 3.87–5.11)
RBC: 2.38 MIL/uL — ABNORMAL LOW (ref 3.87–5.11)
RDW: 15.7 % — ABNORMAL HIGH (ref 11.5–15.5)
RDW: 15.8 % — ABNORMAL HIGH (ref 11.5–15.5)
WBC: 11.4 10*3/uL — ABNORMAL HIGH (ref 4.0–10.5)
WBC: 11.2 10*3/uL — ABNORMAL HIGH (ref 4.0–10.5)
nRBC: 0.8 % — ABNORMAL HIGH (ref 0.0–0.2)
nRBC: 0.8 % — ABNORMAL HIGH (ref 0.0–0.2)

## 2019-11-08 LAB — BPAM RBC
Blood Product Expiration Date: 202111052359
Blood Product Expiration Date: 202111052359
Blood Product Expiration Date: 202111052359
Blood Product Expiration Date: 202111052359
Blood Product Expiration Date: 202111052359
Blood Product Expiration Date: 202111052359
Blood Product Expiration Date: 202111052359
Blood Product Expiration Date: 202111052359
Blood Product Expiration Date: 202111062359
Blood Product Expiration Date: 202111072359
Blood Product Expiration Date: 202111082359
Blood Product Expiration Date: 202111082359
ISSUE DATE / TIME: 202110052327
ISSUE DATE / TIME: 202110052327
ISSUE DATE / TIME: 202110060221
ISSUE DATE / TIME: 202110060221
ISSUE DATE / TIME: 202110060959
ISSUE DATE / TIME: 202110061145
ISSUE DATE / TIME: 202110070454
ISSUE DATE / TIME: 202110070454
Unit Type and Rh: 5100
Unit Type and Rh: 5100
Unit Type and Rh: 5100
Unit Type and Rh: 5100
Unit Type and Rh: 5100
Unit Type and Rh: 5100
Unit Type and Rh: 5100
Unit Type and Rh: 5100
Unit Type and Rh: 5100
Unit Type and Rh: 5100
Unit Type and Rh: 5100
Unit Type and Rh: 5100

## 2019-11-08 LAB — RETICULOCYTES
Immature Retic Fract: 42.5 % — ABNORMAL HIGH (ref 2.3–15.9)
RBC.: 2.13 MIL/uL — ABNORMAL LOW (ref 3.87–5.11)
Retic Count, Absolute: 77.3 10*3/uL (ref 19.0–186.0)
Retic Ct Pct: 3.6 % — ABNORMAL HIGH (ref 0.4–3.1)

## 2019-11-08 LAB — PREPARE RBC (CROSSMATCH)

## 2019-11-08 LAB — FERRITIN: Ferritin: 63 ng/mL (ref 11–307)

## 2019-11-08 LAB — COMPREHENSIVE METABOLIC PANEL
ALT: 10 U/L (ref 0–44)
AST: 30 U/L (ref 15–41)
Albumin: 2.3 g/dL — ABNORMAL LOW (ref 3.5–5.0)
Alkaline Phosphatase: 75 U/L (ref 38–126)
Anion gap: 8 (ref 5–15)
BUN: 8 mg/dL (ref 6–20)
CO2: 23 mmol/L (ref 22–32)
Calcium: 8.1 mg/dL — ABNORMAL LOW (ref 8.9–10.3)
Chloride: 109 mmol/L (ref 98–111)
Creatinine, Ser: 0.65 mg/dL (ref 0.44–1.00)
GFR, Estimated: >60 mL/min (ref >60)
Glucose, Bld: 77 mg/dL (ref 70–99)
Potassium: 3.7 mmol/L (ref 3.5–5.1)
Sodium: 140 mmol/L (ref 135–145)
Total Bilirubin: 0.6 mg/dL (ref 0.3–1.2)
Total Protein: 5.6 g/dL — ABNORMAL LOW (ref 6.5–8.1)

## 2019-11-08 LAB — GLUCOSE, CAPILLARY
Glucose-Capillary: 77 mg/dL (ref 70–99)
Glucose-Capillary: 71 mg/dL (ref 70–99)

## 2019-11-08 LAB — IRON AND TIBC
Iron: 32 ug/dL (ref 28–170)
Saturation Ratios: 12 % (ref 10.4–31.8)
TIBC: 274 ug/dL (ref 250–450)
UIBC: 242 ug/dL

## 2019-11-08 MED ORDER — COCONUT OIL OIL
TOPICAL_OIL | Status: AC
  Filled 2019-11-08: qty 120

## 2019-11-08 MED ORDER — VITAMIN B-12 1000 MCG PO TABS
1000.0000 ug | ORAL_TABLET | Freq: Every day | ORAL | Status: DC
  Administered 2019-11-08 – 2019-11-09 (×2): 1000 ug via ORAL
  Filled 2019-11-08 (×2): qty 1

## 2019-11-08 NOTE — Progress Notes (Signed)
Progress Note - Vaginal Delivery  Kathleen Barnes is a 19 y.o. G1P1001 now PP day 4 s/p Vaginal, Spontaneous .   Subjective:  The patient reports no complaints and tolerating PO has been OOB without lightheadedness Bottlefeeding. Confirmed pt is now staying with mom when discharged.  Objective:  Vital signs in last 24 hours: Temp:  [98.3 F (36.8 C)-99.1 F (37.3 C)] 98.5 F (36.9 C) (10/09 0736) Pulse Rate:  [69-77] 69 (10/09 0956) Resp:  [14-20] 14 (10/09 0956) BP: (130-148)/(82-92) 132/91 (10/09 0956) SpO2:  [99 %] 99 % (10/08 1056)  Physical Exam:  General: alert and cooperative Lochia: appropriate Uterine Fundus: firm Generalized edema much improved Labial edema improved   Data Review Recent Labs    11/07/19 1201 11/08/19 0521  HGB 7.4* 6.9*  HCT 21.0* 19.3*    Assessment/Plan: Active Problems:   Normal labor   PPH (postpartum hemorrhage)   Acute blood loss anemia   Count dropped a little today but pt not bleeding, suspect equalization.  Platelets improved.   OOB Follow symptoms for anemia - If at all symptomatic will transfuse pRBCs Remove Foley - attempt urination     Elonda Husky, M.D. 11/08/2019 10:22 AM

## 2019-11-08 NOTE — Progress Notes (Signed)
[facility] Hematology/Oncology Progress Note  Date of admission: 11/04/2019  Hospital day:  11/08/2019  Chief Complaint: Kathleen Barnes is a 19 y.o. female currently day 4 s/p vaginal delivery and massive postpartum hemorrhage.   Subjective:  She feels good.  She notes very minimal bleeding.  She denies any shortness of breath, chest pain, or dizziness.   Social History: The patient is accompanied by her boyfriend, baby, and nurse today.  Allergies: No Known Allergies  Scheduled Medications: . sodium chloride   Intravenous Once  . sodium chloride   Intravenous Once  . sodium chloride   Intravenous Once  . docusate sodium  100 mg Oral BID  . docusate sodium  100 mg Oral BID  . ibuprofen  600 mg Oral Q6H  . ibuprofen  600 mg Oral Q6H  . prenatal multivitamin  1 tablet Oral Q1200  . prenatal multivitamin  1 tablet Oral Q1200  . Tdap  0.5 mL Intramuscular Once  . Tdap  0.5 mL Intramuscular Once  . vitamin B-12  1,000 mcg Oral Daily    Review of Systems: GENERAL:  Feels "very good".  Out of bed and showered.  No fevers or chills. PERFORMANCE STATUS (ECOG):  1 HEENT:  No visual changes, runny nose, sore throat, mouth sores or tenderness. Lungs: No shortness of breath or cough.  No hemoptysis. Cardiac:  No chest pain, palpitations, orthopnea, or PND. GI:  Eating well.  Notes 1 bowel movement.  No nausea, vomiting, diarrhea, constipation, melena or hematochezia. GU:  No urgency, frequency, dysuria, or hematuria.  Foley catheter out.  Voiding well. Musculoskeletal:  No back pain.  No joint pain.  No muscle tenderness. Extremities:  Lower extremity swelling, stable to improved. Skin:  No rashes or skin changes. Neuro:  No headache, numbness or weakness, balance or coordination issues. Psych:  No mood changes, depression or anxiety. Pain:  No pain. Review of systems:  All other systems reviewed and found to be negative.  Physical Exam: Blood pressure (!) 138/92,  pulse 85, temperature 99.2 F (37.3 C), temperature source Oral, resp. rate 14, height 5' (1.524 m), weight 187 lb 6.3 oz (85 kg), last menstrual period 02/18/2019, SpO2 99 %, unknown if currently breastfeeding.  GENERAL:  Well developed, well nourished, young woman sitting comfortably in bed on the lab in the delivery unit with her baby at her side.  MENTAL STATUS:  Alert and oriented to person, place and time. HEAD:  Brown hair with braids.  Normocephalic, atraumatic, face full and symmetric, no Cushingoid features. EYES:  Brown eyes with stable scleral hemorrhages.  Pupils equal round and reactive to light and accomodation.  No conjunctivitis or scleral icterus. RESPIRATORY:  Clear to auscultation without rales, wheezes or rhonchi. CARDIOVASCULAR:  Regular rate and rhythm without murmur, rub or gallop. ABDOMEN:  Soft, non-tender, with active bowel sounds, and no hepatosplenomegaly.  No masses. SKIN:  No rashes, ulcers or lesions. EXTREMITIES: ICDs in place.  Bilateral stable 2+ lower extremity edema. No skin discoloration or tenderness.  No palpable cords. NEUROLOGICAL: Unremarkable. PSYCH:  Appropriate.   Results for orders placed or performed during the hospital encounter of 11/04/19 (from the past 48 hour(s))  CBC     Status: Abnormal   Collection Time: 11/06/19  7:28 PM  Result Value Ref Range   WBC 12.9 (H) 4.0 - 10.5 K/uL   RBC 2.37 (L) 3.87 - 5.11 MIL/uL   Hemoglobin 7.2 (L) 12.0 - 15.0 g/dL   HCT 81.120.7 (  L) 36 - 46 %   MCV 87.3 80.0 - 100.0 fL   MCH 30.4 26.0 - 34.0 pg   MCHC 34.8 30.0 - 36.0 g/dL   RDW 16.1 (H) 09.6 - 04.5 %   Platelets 66 (L) 150 - 400 K/uL    Comment: Immature Platelet Fraction may be clinically indicated, consider ordering this additional test [id number]    nRBC 0.5 (H) 0.0 - 0.2 %    Comment: Performed at [facility], [address] [place], [place] [address]  Comprehensive metabolic panel     Status: Abnormal   Collection Time: 11/06/19   7:28 PM  Result Value Ref Range   Sodium 140 135 - 145 mmol/L   Potassium 3.7 3.5 - 5.1 mmol/L   Chloride 109 98 - 111 mmol/L   CO2 21 (L) 22 - 32 mmol/L   Glucose, Bld 85 70 - 99 mg/dL    Comment: Glucose reference range applies only to samples taken after fasting for at least 8 hours.   BUN 12 6 - 20 mg/dL   Creatinine, Ser 5.62 0.44 - 1.00 mg/dL   Calcium 7.4 (L) 8.9 - 10.3 mg/dL   Total Protein 4.3 (L) 6.5 - 8.1 g/dL   Albumin 2.0 (L) 3.5 - 5.0 g/dL   AST 40 15 - 41 U/L   ALT 10 0 - 44 U/L   Alkaline Phosphatase 59 38 - 126 U/L   Total Bilirubin 0.6 0.3 - 1.2 mg/dL   GFR calc non Af Amer >60 >60 mL/min   Anion gap 10 5 - 15    Comment: Performed at [facility], [address] [place], [place] [address]  Reticulocytes     Status: Abnormal   Collection Time: 11/06/19  7:28 PM  Result Value Ref Range   Retic Ct Pct 2.6 0.4 - 3.1 %   RBC. 2.31 (L) 3.87 - 5.11 MIL/uL   Retic Count, Absolute 60.1 19.0 - 186.0 K/uL   Immature Retic Fract 43.4 (H) 2.3 - 15.9 %    Comment: Performed at [facility], [address] [place], [place] [address]  DIC Panel [facility] Only)     Status: Abnormal   Collection Time: 11/06/19  7:28 PM  Result Value Ref Range   Prothrombin Time 12.2 11.4 - 15.2 seconds   INR 0.9 0.8 - 1.2    Comment: (NOTE) INR goal varies based on device and disease states.    aPTT 27 24 - 36 seconds   Fibrinogen 420 210 - 475 mg/dL   Fibrin derivatives D-dimer ([facility]) 840.08 (H) 0.00 - 499.00 ng/mL (FEU)    Comment: (NOTE) <> Exclusion of Venous Thromboembolism (VTE) - OUTPATIENT ONLY   (Emergency Department or [place])    0-499 ng/ml (FEU): With a low to intermediate pretest probability                      for VTE this test result excludes the diagnosis                      of VTE.   >499 ng/ml (FEU) : VTE not excluded; additional work up for VTE is                      required.  <> Testing on Inpatients and Evaluation of Disseminated  Intravascular   Coagulation (DIC) Reference Range:   0-499 ng/ml (FEU)    Platelets 66 (L) 150 -  400 K/uL    Comment: Immature Platelet Fraction may be clinically indicated, consider ordering this additional test [id number]    Smear Review NO SCHISTOCYTES SEEN     Comment: Performed at [facility], [address] [place], [place] [address]  CBC     Status: Abnormal   Collection Time: 11/07/19  6:08 AM  Result Value Ref Range   WBC 12.2 (H) 4.0 - 10.5 K/uL   RBC 2.35 (L) 3.87 - 5.11 MIL/uL   Hemoglobin 7.2 (L) 12.0 - 15.0 g/dL   HCT 71.2 (L) 36 - 46 %   MCV 88.9 80.0 - 100.0 fL   MCH 30.6 26.0 - 34.0 pg   MCHC 34.4 30.0 - 36.0 g/dL   RDW 45.8 (H) 09.9 - 83.3 %   Platelets 69 (L) 150 - 400 K/uL    Comment: Immature Platelet Fraction may be clinically indicated, consider ordering this additional test [id number]    nRBC 0.7 (H) 0.0 - 0.2 %    Comment: Performed at [facility], [address] [place], [place] [address]  Basic metabolic panel     Status: Abnormal   Collection Time: 11/07/19  6:08 AM  Result Value Ref Range   Sodium 138 135 - 145 mmol/L   Potassium 3.5 3.5 - 5.1 mmol/L   Chloride 108 98 - 111 mmol/L   CO2 21 (L) 22 - 32 mmol/L   Glucose, Bld 116 (H) 70 - 99 mg/dL    Comment: Glucose reference range applies only to samples taken after fasting for at least 8 hours.   BUN 11 6 - 20 mg/dL   Creatinine, Ser 9.37 0.44 - 1.00 mg/dL   Calcium 7.6 (L) 8.9 - 10.3 mg/dL   GFR calc non Af Amer >60 >60 mL/min   Anion gap 9 5 - 15    Comment: Performed at [facility], [address] [place], [place] [address]  Protime-INR     Status: None   Collection Time: 11/07/19  6:08 AM  Result Value Ref Range   Prothrombin Time 12.5 11.4 - 15.2 seconds   INR 1.0 0.8 - 1.2    Comment: (NOTE) INR goal varies based on device and disease states. Performed at [facility], [address] [place], [place] [address]   APTT     Status: None    Collection Time: 11/07/19  6:08 AM  Result Value Ref Range   aPTT 29 24 - 36 seconds    Comment: Performed at [facility], [address] [place], [place] [address]  Fibrinogen     Status: None   Collection Time: 11/07/19  6:08 AM  Result Value Ref Range   Fibrinogen 461 210 - 475 mg/dL    Comment: Performed at [facility], [address] [place], [place] [address]  Folate, serum, performed at [facility] lab     Status: None   Collection Time: 11/07/19  6:08 AM  Result Value Ref Range   Folate 18.9 >5.9 ng/mL    Comment: Performed at [facility], [address] [place], [place] [address]  Vitamin B12     Status: None   Collection Time: 11/07/19  6:08 AM  Result Value Ref Range   Vitamin B-12 296 180 - 914 pg/mL    Comment: (NOTE) This assay is not validated for testing neonatal or myeloproliferative syndrome specimens for Vitamin B12 levels. Performed at [facility], [address], [place], [place] [address]  CBC with Differential/Platelet     Status: Abnormal   Collection Time: 11/07/19 12:01 PM  Result Value Ref Range   WBC 12.3 (H) 4.0 - 10.5 K/uL   RBC 2.37 (L) 3.87 - 5.11 MIL/uL   Hemoglobin 7.4 (L) 12.0 - 15.0 g/dL   HCT 01.7 (L) 36 - 46 %   MCV 88.6 80.0 - 100.0 fL   MCH 31.2 26.0 - 34.0 pg   MCHC 35.2 30.0 - 36.0 g/dL   RDW 51.0 (H) 25.8 - 52.7 %   Platelets 72 (L) 150 - 400 K/uL    Comment: Immature Platelet Fraction may be clinically indicated, consider ordering this additional test [id number]    nRBC 0.5 (H) 0.0 - 0.2 %   Neutrophils Relative % 78 %   Neutro Abs 9.5 (H) 1.7 - 7.7 K/uL   Lymphocytes Relative 11 %   Lymphs Abs 1.3 0.7 - 4.0 K/uL   Monocytes Relative 8 %   Monocytes Absolute 1.0 0.1 - 1.0 K/uL   Eosinophils Relative 0 %   Eosinophils Absolute 0.0 0 - 0 K/uL   Basophils Relative 0 %   Basophils Absolute 0.0 0 - 0 K/uL   Immature Granulocytes 3 %   Abs Immature Granulocytes 0.42 (H) 0.00 - 0.07  K/uL    Comment: Performed at [facility], [address] [place], [place] [address]  CBC     Status: Abnormal   Collection Time: 11/08/19  5:21 AM  Result Value Ref Range   WBC 11.4 (H) 4.0 - 10.5 K/uL   RBC 2.16 (L) 3.87 - 5.11 MIL/uL   Hemoglobin 6.9 (L) 12.0 - 15.0 g/dL   HCT 15.4 (L) 36 - 46 %   MCV 89.4 80.0 - 100.0 fL   MCH 31.9 26.0 - 34.0 pg   MCHC 35.8 30.0 - 36.0 g/dL   RDW 00.8 (H) 67.6 - 19.5 %   Platelets 83 (L) 150 - 400 K/uL    Comment: REPEATED TO VERIFY Immature Platelet Fraction may be clinically indicated, consider ordering this additional test [id number] CONSISTENT WITH PREVIOUS RESULT    nRBC 0.8 (H) 0.0 - 0.2 %    Comment: Performed at [facility], [address] [place], [place] [address]  Type and screen [facility]     Status: None   Collection Time: 11/08/19  5:21 AM  Result Value Ref Range   ABO/RH(D) O POS    Antibody Screen NEG    Sample Expiration      11/11/2019,2359 Performed at [facility], [address] [place], [place] [address]   Ferritin     Status: None   Collection Time: 11/08/19  5:21 AM  Result Value Ref Range   Ferritin 63 11 - 307 ng/mL    Comment: Performed at [facility], [address] [place], [place] [address]  Iron and TIBC     Status: None   Collection Time: 11/08/19  5:21 AM  Result Value Ref Range   Iron 32 28 - 170 ug/dL   TIBC 976 734 - 193 ug/dL   Saturation Ratios 12 10.4 - 31.8 %   UIBC 242 ug/dL    Comment: Performed at [facility], [address] [place], [place] [address]  Reticulocytes     Status: Abnormal   Collection Time: 11/08/19  5:21 AM  Result Value Ref Range   Retic Ct Pct 3.6 (H) 0.4 - 3.1 %   RBC. 2.13 (L) 3.87 -  5.11 MIL/uL   Retic Count, Absolute 77.3 19.0 - 186.0 K/uL   Immature Retic Fract 42.5 (H) 2.3 - 15.9 %    Comment: Performed at [facility], [address] [place], [place] [address]  Glucose,  capillary     Status: None   Collection Time: 11/08/19  7:59 AM  Result Value Ref Range   Glucose-Capillary 77 70 - 99 mg/dL    Comment: Glucose reference range applies only to samples taken after fasting for at least 8 hours.  Glucose, capillary     Status: None   Collection Time: 11/08/19 11:52 AM  Result Value Ref Range   Glucose-Capillary 71 70 - 99 mg/dL    Comment: Glucose reference range applies only to samples taken after fasting for at least 8 hours.   No results found.  Assessment:  Lama S Tedrick is a 19 y.o. female with currently day 4 s/p delivery with massive postpartum hemorrhage.  She has no history of any bleeding diathesis.  Menses pre-pregnancy was unremarkable.  She denies any use of aspirin, ibuprofen or herbal products.  She has no family history of any bleeding disorders.  She has undergone bakri balloon x2, vaginal exploration, and curettage x2.  She is undergone uterine artery embolization (left and right).    She has received 9 units of PRBCs, 6 units of FFP and 1 unit of platelets.  Excessive bleeding has resolved; she has minimal postpartum bleeding per patient.  Peripheral smear reveals normocytic anemia with circulting nucleated RBCs.  Schistocytes not identified.  There was mild leukocytosis with left shift and thrombocytopenia.  Symptomatically, she continues to feel good.  Exam is stable.  Plan:   1.  Post-partum hemorrhage             Etiology is unclear.                           Notes indicate no evidence of diffuse or focal atony.                          There was no evidence of trauma with laceration or incisions.                         There were no retained products of conception.                          History does not suggest an underlying coagulopathy as she had normal menses with no history of excess bruising or bleeding.                         She denies any use of aspirin, ibuprofen or herbal products.             Plan for work-up  of a bleeding diathesis approximately 2 weeks postpartum given multiple blood products received.             PT and PTT were normal on 11/07/2019.                         Fibrinogen was 461.             Hemoglobin drifting down.   7.2 at 06:08 AM and 7.4 at 12:01 PM on 11/07/2019.  6.9 at 05:21 today.   Ferritin 63 and iron saturation 12%.    Ferritin goal 100.  Iron saturation goal 18%.   Discuss oral iron.  Discuss issues with constipation.    Discussed with Dr Logan Bores plan to observe for now as she is asymptomatic.   CBC this evening and consideration of transfusion if hemoglobin dropping.             Platelet count slowly improving   66,000 to 69,000 to 72,000 to 83,000.   Anticipate daily improvement.  Type and screen performed this morning.    2.  B12 deficiency  B12 level was 296 on 11/07/2019.  B12 goal is 400.  B12 1000 mcg po q day started today.  Folate was 18.9 (normal). 3.   Edema             Patient has edema secondary to significant blood products received, low albumen (2.0) and anemia.             She is mobilizing fluids.  Urine output is good.  Foley catheter is out. 4.   Electrolytes             Electrolyte are stable s/p massive transfusion.             Creatinine was 0.79 (improved) and potassium 3.5 on 11/07/2019.             Calcium was 7.6 with an albumen of 2.0.  Check electrolytes in AM. 5.   Disposition  Anticipate discharge in the next 1-2 days.    Rosey Bath, MD  11/08/2019, 5:01 PM

## 2019-11-08 NOTE — Lactation Note (Signed)
Lactation Consultation Note  Patient Name: Kathleen Barnes Date: 11/08/2019   Mom is still putting [name] to the breast.  She reports that [name] will breast feed for a short time sometimes before getting frustrated and then sometimes she will not even latch.  Demonstrated hand expression, but was unable to express any milk.  Set up Symphony DEBP for mom to pump but was unable to get even a drop.  Explained that there could be a delay in the mature milk transitioning in due to her excessive blood loss.  Encouraged mom to keep pumping around every 3 hours to bring in mature milk and to ensure a plentiful milk supply.  Reviewed warmth, breast massage, hand expression, pumping, collection, storage, labeling, cleaning, handling and giving expressed milk.  Encouraged mom to call with any questions, concerns or assistance.    Maternal Data    Feeding    LATCH Score                   Interventions    Lactation Tools Discussed/Used     Consult Status      Louis Meckel 11/08/2019, 7:01 PM

## 2019-11-09 LAB — PREPARE PLATELET PHERESIS: Unit division: 0

## 2019-11-09 LAB — CBC
HCT: 19.5 % — ABNORMAL LOW (ref 36.0–46.0)
Hemoglobin: 6.7 g/dL — ABNORMAL LOW (ref 12.0–15.0)
MCH: 30.6 pg (ref 26.0–34.0)
MCHC: 34.4 g/dL (ref 30.0–36.0)
MCV: 89 fL (ref 80.0–100.0)
Platelets: 96 10*3/uL — ABNORMAL LOW (ref 150–400)
RBC: 2.19 MIL/uL — ABNORMAL LOW (ref 3.87–5.11)
RDW: 15.5 % (ref 11.5–15.5)
WBC: 8.8 10*3/uL (ref 4.0–10.5)
nRBC: 0.8 % — ABNORMAL HIGH (ref 0.0–0.2)

## 2019-11-09 LAB — BPAM PLATELET PHERESIS
Blood Product Expiration Date: 202110092359
Unit Type and Rh: 7300

## 2019-11-09 MED ORDER — VARICELLA VIRUS VACCINE LIVE 1350 PFU/0.5ML IJ SUSR
0.5000 mL | Freq: Once | INTRAMUSCULAR | Status: AC
  Administered 2019-11-09: 0.5 mL via SUBCUTANEOUS
  Filled 2019-11-09 (×2): qty 0.5

## 2019-11-09 MED ORDER — FERROUS SULFATE 325 (65 FE) MG PO TABS: 325.0000 mg | ORAL_TABLET | Freq: Two times a day (BID) | ORAL | 1 refills | Status: DC

## 2019-11-09 NOTE — Discharge Summary (Signed)
Patient Name: Kathleen Barnes DOB: 08-Dec-2000 MRN: [id number]                            Discharge Summary  Date of Admission: 11/04/2019 Date of Discharge: 11/09/2019 Delivering Provider: Harlin Heys   Admitting Diagnosis: Normal labor [O80, Z37.9] at 95w0dSecondary diagnosis:  Active Problems:   Normal labor   PPH (postpartum hemorrhage)   Acute blood loss anemia   Mode of Delivery: normal spontaneous vaginal delivery              Discharge diagnosis: Term Pregnancy Delivered, GDM A2, Anemia and PPH      Intrapartum Procedures: pitocin augmentation   Post partum procedures: blood transfusion, curettage  and Bakri balloon and uterine artery embolization                      Discharge Day SOAP Note:  Progress Note - Vaginal Delivery  Kathleen Barnes is a 19y.o. G1P1001 now PP day 5 s/p Vaginal, Spontaneous . Delivery was complicated by gestational diabetes and subsequent PPH requiring Bakri balloon, curettage, uterine artery embolization, multiple blood transfusions (RBCs, FFP, Platelets).  Subjective  The patient has the following complaints: has no unusual complaints.  She reports no lightheadedness or difficulty in and out of bed.   Pain is controlled with current medications.   Patient is urinating without difficulty.  She is ambulating well.  Swelling of labia continues to improve. We have discussed additional blood transfusion today, and the patient is certain she does not want any further blood.  She reports herself to be asymptomatic.  Her mother says she will not be by herself and will have help at home.  Objective  Vital signs: BP (!) 144/92 (BP Location: Right Arm)   Pulse 71   Temp 98 F (36.7 C) (Oral)   Resp 14   Ht 5' (1.524 m)   Wt 85 kg   LMP 02/18/2019   SpO2 99%   Breastfeeding Unknown   BMI 36.60 kg/m   Physical Exam: Gen: NAD Fundus Fundal Tone: Firm  Lochia Amount: Small          Data Review Labs: Lab Results  Component  Value Date   WBC 8.8 11/09/2019   HGB 6.7 (L) 11/09/2019   HCT 19.5 (L) 11/09/2019   MCV 89.0 11/09/2019   PLT 96 (L) 11/09/2019   CBC Latest Ref Rng & Units 11/09/2019 11/08/2019 11/08/2019  WBC 4.0 - 10.5 K/uL 8.8 11.2(H) 11.4(H)  Hemoglobin 12.0 - 15.0 g/dL 6.7(L) 7.4(L) 6.9(L)  Hematocrit 36 - 46 % 19.5(L) 21.4(L) 19.3(L)  Platelets 150 - 400 K/uL 96(L) 112(L) 83(L)   O POS  Edinburgh Score: Edinburgh Postnatal Depression Scale Screening Tool 11/07/2019  I have been able to laugh and see the funny side of things. 0  I have looked forward with enjoyment to things. 0  I have blamed myself unnecessarily when things went wrong. 2  I have been anxious or worried for no good reason. 1  I have felt scared or panicky for no good reason. 2  Things have been getting on top of me. 0  I have been so unhappy that I have had difficulty sleeping. 0  I have felt sad or miserable. 1  I have been so unhappy that I have been crying. 1  The thought of harming myself has occurred to me. 0  Edinburgh Postnatal Depression Scale Total 7    Assessment/Plan  Active Problems:   Normal labor   PPH (postpartum hemorrhage)   Acute blood loss anemia    Plan for discharge today.  Discharge Instructions: Per After Visit Summary. Activity: Advance as tolerated. Pelvic rest for 6 weeks.  Also refer to After Visit Summary Diet: Regular Medications: Allergies as of 11/09/2019   No Known Allergies     Medication List    STOP taking these medications   Accu-Chek Nano SmartView w/Device Kit   Accu-Chek SmartView test strip Generic drug: glucose blood   glyBURIDE 5 MG tablet Commonly known as: DIABETA   insulin NPH Human 100 UNIT/ML injection Commonly known as: NOVOLIN N   insulin regular 100 units/mL injection Commonly known as: HumuLIN R   Needle (Disp) 32G X 5/16" Misc   Syringe (Disposable) 1 ML Misc     TAKE these medications   ferrous sulfate 325 (65 FE) MG tablet Take 1 tablet  (325 mg total) by mouth 2 (two) times daily with a meal.   multivitamin-prenatal 27-0.8 MG Tabs tablet Take 1 tablet by mouth daily at 12 noon.      Outpatient follow up:   Follow-up Information    Harlin Heys, MD Follow up in 4 day(s).   Specialties: Obstetrics and Gynecology, Radiology Contact information: [address] [place] [address] [phone]              Postpartum contraception: Will discuss at first office visit post-partum  Discharged Condition: good  Discharged to: home  Newborn Data: Disposition:home with mother  Apgars: APGAR (1 MIN): 8   APGAR (5 MINS): 9   APGAR (10 MINS):    Baby Feeding: Bottle    Finis Bud, M.D. 11/09/2019 8:43 AM

## 2019-11-09 NOTE — Lactation Note (Signed)
Lactation Consultation Note  Patient Name: Kathleen Barnes Date: 11/09/2019   Assisted mom with breast feeding and pumping one last time before discharge.  This was the first time, probably due to mom's excessive blood loss, that we were able to get a few drops of milk when hand expressing and pumping.  Mom says she has been putting Kathleen Barnes to the breast occasionally, but she is not satisfied.  Lactation [employer] given and reviewed encouraging mom to call with any questions, concerns or assistance.  Maternal Data    Feeding    LATCH Score                   Interventions    Lactation Tools Discussed/Used     Consult Status      Louis Meckel 11/09/2019, 9:36 PM

## 2019-11-09 NOTE — Discharge Instructions (Signed)
Anemia  Anemia is a condition in which you do not have enough red blood cells or hemoglobin. Hemoglobin is a substance in red blood cells that carries oxygen. When you do not have enough red blood cells or hemoglobin (are anemic), your body cannot get enough oxygen and your organs may not work properly. As a result, you may feel very tired or have other problems. What are the causes? Common causes of anemia include:  Excessive bleeding. Anemia can be caused by excessive bleeding inside or outside the body, including bleeding from the intestine or from periods in women.  Poor nutrition.  Long-lasting (chronic) kidney, thyroid, and liver disease.  Bone marrow disorders.  Cancer and treatments for cancer.  HIV (human immunodeficiency virus) and AIDS (acquired immunodeficiency syndrome).  Treatments for HIV and AIDS.  Spleen problems.  Blood disorders.  Infections, medicines, and autoimmune disorders that destroy red blood cells. What are the signs or symptoms? Symptoms of this condition include:  Minor weakness.  Dizziness.  Headache.  Feeling heartbeats that are irregular or faster than normal (palpitations).  Shortness of breath, especially with exercise.  Paleness.  Cold sensitivity.  Indigestion.  Nausea.  Difficulty sleeping.  Difficulty concentrating. Symptoms may occur suddenly or develop slowly. If your anemia is mild, you may not have symptoms. How is this diagnosed? This condition is diagnosed based on:  Blood tests.  Your medical history.  A physical exam.  Bone marrow biopsy. Your health care provider may also check your stool (feces) for blood and may do additional testing to look for the cause of your bleeding. You may also have other tests, including:  Imaging tests, such as a CT scan or MRI.  Endoscopy.  Colonoscopy. How is this treated? Treatment for this condition depends on the cause. If you continue to lose a lot of blood, you may  need to be treated at a hospital. Treatment may include:  Taking supplements of iron, vitamin B12, or folic acid.  Taking a hormone medicine (erythropoietin) that can help to stimulate red blood cell growth.  Having a blood transfusion. This may be needed if you lose a lot of blood.  Making changes to your diet.  Having surgery to remove your spleen. Follow these instructions at home:  Take over-the-counter and prescription medicines only as told by your health care provider.  Take supplements only as told by your health care provider.  Follow any diet instructions that you were given.  Keep all follow-up visits as told by your health care provider. This is important. Contact a health care provider if:  You develop new bleeding anywhere in the body. Get help right away if:  You are very weak.  You are short of breath.  You have pain in your abdomen or chest.  You are dizzy or feel faint.  You have trouble concentrating.  You have bloody or black, tarry stools.  You vomit repeatedly or you vomit up blood. Summary  Anemia is a condition in which you do not have enough red blood cells or enough of a substance in your red blood cells that carries oxygen (hemoglobin).  Symptoms may occur suddenly or develop slowly.  If your anemia is mild, you may not have symptoms.  This condition is diagnosed with blood tests as well as a medical history and physical exam. Other tests may be needed.  Treatment for this condition depends on the cause of the anemia. This information is not intended to replace advice given to you by   your health care provider. Make sure you discuss any questions you have with your health care provider. Document Revised: 12/29/2016 Document Reviewed: 02/18/2016 Elsevier Patient Education  2020 Elsevier Inc.  

## 2019-11-09 NOTE — Plan of Care (Signed)
Discharge information gone over with patient and significant other.  Questions answered and resources given for any questions that may come up later.  Discharge packet reviewed and given to the patient.

## 2019-11-09 NOTE — Plan of Care (Signed)
Discharge information gone over with patient, patient's mother and significant other.  Dr Logan Bores at bedside discussed plan for discharge and care.  Questions answered and follow up information reviewed.

## 2019-11-12 ENCOUNTER — Ambulatory Visit (INDEPENDENT_AMBULATORY_CARE_PROVIDER_SITE_OTHER): Payer: No Typology Code available for payment source | Admitting: Obstetrics and Gynecology

## 2019-11-12 ENCOUNTER — Encounter: Payer: Self-pay | Admitting: Obstetrics and Gynecology

## 2019-11-12 ENCOUNTER — Other Ambulatory Visit: Payer: Self-pay

## 2019-11-12 DIAGNOSIS — D62 Acute posthemorrhagic anemia: Secondary | ICD-10-CM

## 2019-11-12 NOTE — Progress Notes (Signed)
HPI:      [name] is a 19 y.o. G1P1001 who LMP was No LMP recorded.  Subjective:   She presents today approximately 1 week from discharge from the hospital.  She underwent vaginal delivery and then subsequent postpartum hemorrhage requiring significant intervention including curettage, Bakri balloon x2, uterine artery embolization.  She received multiple units of blood fresh frozen plasma and platelets. She reports that she is doing very well at this time.  She reports no issues with lightheadedness or day-to-day functioning.  She says her vaginal bleeding is minimal. She reports her vulvar swelling as getting better every day. She is taking iron as directed. She reports the baby is doing well.    Hx: The following portions of the patient's history were reviewed and updated as appropriate:             She  has a past medical history of Gestational diabetes and History of chlamydia infection (10/16/2017). She does not have any pertinent problems on file. She  has a past surgical history that includes no surgical history; EMBOLIZATION (N/A, 11/05/2019); and Abdominal hysterectomy (N/A, 11/05/2019). Her family history includes Healthy in her father; Heart attack in her maternal grandmother; Hypertension in her mother; Migraines in her maternal grandmother. She  reports that she has never smoked. She has never used smokeless tobacco. She reports that she does not drink alcohol and does not use drugs. She has a current medication list which includes the following prescription(s): ferrous sulfate and multivitamin-prenatal. She has No Known Allergies.       Review of Systems:  Review of Systems  Constitutional: Denied constitutional symptoms, night sweats, recent illness, fatigue, fever, insomnia and weight loss.  Eyes: Denied eye symptoms, eye pain, photophobia, vision change and visual disturbance.  Ears/Nose/Throat/Neck: Denied ear, nose, throat or neck symptoms, hearing loss, nasal  discharge, sinus congestion and sore throat.  Cardiovascular: Denied cardiovascular symptoms, arrhythmia, chest pain/pressure, edema, exercise intolerance, orthopnea and palpitations.  Respiratory: Denied pulmonary symptoms, asthma, pleuritic pain, productive sputum, cough, dyspnea and wheezing.  Gastrointestinal: Denied, gastro-esophageal reflux, melena, nausea and vomiting.  Genitourinary: Denied genitourinary symptoms including symptomatic vaginal discharge, pelvic relaxation issues, and urinary complaints.  Musculoskeletal: Denied musculoskeletal symptoms, stiffness, swelling, muscle weakness and myalgia.  Dermatologic: Denied dermatology symptoms, rash and scar.  Neurologic: Denied neurology symptoms, dizziness, headache, neck pain and syncope.  Psychiatric: Denied psychiatric symptoms, anxiety and depression.  Endocrine: Denied endocrine symptoms including hot flashes and night sweats.   Meds:   Current Outpatient Medications on File Prior to Visit  Medication Sig Dispense Refill  . ferrous sulfate 325 (65 FE) MG tablet Take 1 tablet (325 mg total) by mouth 2 (two) times daily with a meal. 60 tablet 1  . Prenatal Vit-Fe Fumarate-FA (MULTIVITAMIN-PRENATAL) 27-0.8 MG TABS tablet Take 1 tablet by mouth daily at 12 noon.     No current facility-administered medications on file prior to visit.          Objective:     Vitals:   11/12/19 1047  BP: (!) 136/97  Pulse: (!) 107   Filed Weights   11/12/19 1047  Weight: 171 lb 9.6 oz (77.8 kg)                Assessment:    G1P1001 Patient Active Problem List   Diagnosis Date Noted  . Acute blood loss anemia 11/08/2019  . Normal labor 11/04/2019  . PPH (postpartum hemorrhage) 11/04/2019  . Abnormal glucose tolerance test (GTT)  09/12/2019  . Marginal insertion of umbilical cord affecting management of mother 07/17/2019  . Echogenic intracardiac focus of fetus on prenatal ultrasound 07/17/2019  . High risk teen pregnancy,  antepartum 05/16/2019  . Maternal varicella, non-immune 05/16/2019  . History of chlamydia infection 10/16/2017     1. Postpartum care and examination immediately after delivery   2. Acute blood loss anemia   3. Postpartum hemorrhage, unspecified type     Patient doing well -recovering from significant postpartum hemorrhage and sequela.   Plan:            1.  CBC today  2.  Continue iron  3.  Follow-up in 4 weeks for routine postpartum visit  4.  Study for blood dyscrasia/coagulation defects approximately 2 months postpartum. Orders Orders Placed This Encounter  Procedures  . CBC with Differential/Platelet    No orders of the defined types were placed in this encounter.     F/U  No follow-ups on file.  Elonda Husky, M.D. 11/12/2019 11:04 AM

## 2019-11-13 LAB — CBC WITH DIFFERENTIAL/PLATELET
Basophils Absolute: 0.1 10*3/uL (ref 0.0–0.2)
Basos: 1 %
EOS (ABSOLUTE): 0 10*3/uL (ref 0.0–0.4)
Eos: 0 %
Hematocrit: 24.5 % — ABNORMAL LOW (ref 34.0–46.6)
Hemoglobin: 8.1 g/dL — ABNORMAL LOW (ref 11.1–15.9)
Immature Grans (Abs): 0.3 10*3/uL — ABNORMAL HIGH (ref 0.0–0.1)
Immature Granulocytes: 4 %
Lymphocytes Absolute: 1.3 10*3/uL (ref 0.7–3.1)
Lymphs: 14 %
MCH: 30.3 pg (ref 26.6–33.0)
MCHC: 33.1 g/dL (ref 31.5–35.7)
MCV: 92 fL (ref 79–97)
Monocytes Absolute: 0.7 10*3/uL (ref 0.1–0.9)
Monocytes: 8 %
NRBC: 1 % — ABNORMAL HIGH (ref 0–0)
Neutrophils Absolute: 6.4 10*3/uL (ref 1.4–7.0)
Neutrophils: 73 %
Platelets: 216 10*3/uL (ref 150–450)
RBC: 2.67 x10E6/uL — CL (ref 3.77–5.28)
RDW: 15.7 % — ABNORMAL HIGH (ref 11.7–15.4)
WBC: 8.7 10*3/uL (ref 3.4–10.8)

## 2019-11-25 NOTE — Progress Notes (Signed)
[facility]  [address], [address] [place], [place] [address] Phone: [phone]  Fax: [phone]   Clinic Day:  11/26/2019  Referring physician: No ref. provider found  Chief Complaint: Kathleen Barnes is a 19 y.o. female with a history of massive postpartum hemorrhage after vaginal delivery who is seen for follow-up after recent hospitalization.  HPI: The patient was admitted to the hospital from 11/04/2019 - 11/09/2019. Her pregnancy was complicated by gestational diabetes. After spontaneous vaginal delivery on 11/04/2019, she suffered a massive postpartum hemorrhage. She underwentBakriballoon x2, vaginal exploration,and curettage x2.She underwent uterine arteryembolization(left and right). She received9 units of PRBCs, 6 units of FFPand 1 unit of platelets.She was to start oral iron upon discharge.  Labs followed: 05/16/2019: Hematocrit 37.6, hemoglobin 12.8, MCV 83.0, platelets 226,000, WBC   4,100. 09/11/2019: Hematocrit 33.2, hemoglobin 11.1, MCV 85.0, platelets 220,000, WBC   5,700. 11/04/2019: Hematocrit 31.1, hemoglobin 10.3, MCV 81.6, platelets 164,000, WBC   6,100. 11/05/2019: Hematocrit 17.9, hemoglobin   6.3, MCV 89.1, platelets   89,000, WBC 16,500 (ANC 12,700).  11/06/2019: Hematocrit 15.3, hemoglobin   5.6, MCV 86.9, platelets   79,000, WBC 13,600 (ANC 9,800). 11/08/2019: Hematocrit 21.4, hemoglobin   7.4, MCV 89.9, platelets 112,000, WBC 11,200. Ferritin 63 with an iron saturation of 12% and TIBC 274. 11/09/2019: Hematocrit 19.5, hemoglobin   6.7, MCV 89.0, platelets   96,000, WBC   8,800. 11/12/2019: Hematocrit 24.5, hemoglobin   8.1, MCV 92.0, platelets 216,000, WBC   8,700.  Additional labs: Vitamin B12 was 296 (low) and folate 18.9 on 11/07/2019. Peripheral smearon 11/06/2019 revealed normocytic anemia with circulting nucleated RBCs. Schistocytes were not identified. There was mild leukocytosis with left shift and  thrombocytopenia.  She was seen by Dr. Logan BoresEvans on 11/12/2019.  She felt well.  She denied any issues with lightheadedness or day-to-day functioning.  Vaginal bleeding was minimal.  She was taking oral iron.  Symptomatically, she has been "good." She is having trouble sleeping due to her newborn. She has been having spotting since discharge. She reports headaches. She denies chest pain, shortness of breath, palpitations, nausea, vomiting, nose bleeds, gum bleeds, blood in the urine, and blood in the stool. Her leg swelling has resolved.  The patient does not have a big appetite. She eats a bagel for breakfast around 10:00 am and has a full meal for dinner. Her mother is cooking for her and makes meals with meat and dark green leafy vegetables to keep her iron up.  She was discharged on oral iron and takes it BID. She is still taking her prenatal vitamin but is unsure if it has vitamin B12 in it. She has been taking ibuprofen 200 mg BID and last took it yesterday.   Past Medical History:  Diagnosis Date  . Gestational diabetes   . History of chlamydia infection 10/16/2017    Past Surgical History:  Procedure Laterality Date  . ABDOMINAL HYSTERECTOMY N/A 11/05/2019   Procedure: exam under anesthesia, bakri placement, endometrial curretage;  Surgeon: Linzie CollinEvans, David James, MD;  Location: [facility] ORS;  Service: Gynecology;  Laterality: N/A;  . EMBOLIZATION N/A 11/05/2019   Procedure: EMBOLIZATION;  Surgeon: Annice Needyew, Jason S, MD;  Location: [facility] INVASIVE CV LAB;  Service: Cardiovascular;  Laterality: N/A;  . no surgical history      Family History  Problem Relation Age of Onset  . Hypertension Mother   . Healthy Father   . Heart attack Maternal Grandmother   . Migraines Maternal Grandmother  Social History:  reports that she has never smoked. She has never used smokeless tobacco. She reports that she does not drink alcohol and does not use drugs. He baby's name is [employer]ologist, [employer] probation services. She lives in [place].   The patient is alone today.  Allergies: No Known Allergies  Current Medications: Current Outpatient Medications  Medication Sig Dispense Refill  . ferrous sulfate 325 (65 FE) MG tablet Take 1 tablet (325 mg total) by mouth 2 (two) times daily with a meal. 60 tablet 1  . Prenatal Vit-Fe Fumarate-FA (MULTIVITAMIN-PRENATAL) 27-0.8 MG TABS tablet Take 1 tablet by mouth daily at 12 noon.     No current facility-administered medications for this visit.    Review of Systems  Constitutional: Negative for chills, diaphoresis, fever, malaise/fatigue and weight loss.       Feels "good".  HENT: Negative for congestion, ear discharge, ear pain, hearing loss, nosebleeds, sinus pain, sore throat and tinnitus.   Eyes: Negative for blurred vision.  Respiratory: Negative for cough, hemoptysis, sputum production and shortness of breath.   Cardiovascular: Negative for chest pain, palpitations and leg swelling.  Gastrointestinal: Negative for abdominal pain, blood in stool, constipation, diarrhea, heartburn, melena, nausea and vomiting.       Poor appetite. Eating iron-rich foods for dinner.  Genitourinary: Negative for dysuria, frequency, hematuria and urgency.       Vaginal spotting.  Musculoskeletal: Negative for back pain, joint pain, myalgias and neck pain.  Skin: Negative for itching and rash.  Neurological: Positive for headaches. Negative for dizziness, tingling, sensory change and weakness.  Endo/Heme/Allergies: Does not bruise/bleed easily.  Psychiatric/Behavioral: Negative for depression and memory loss. The patient has insomnia (due to newborn). The patient is not nervous/anxious.   All other systems reviewed and are negative.  Performance status (ECOG): 0-1  Vitals Blood pressure 118/86, pulse 94, temperature (!) 97.3 F (36.3 C), temperature source Tympanic, resp. rate 18, height 5' (1.524 m), weight 154 lb 3.4 oz (70 kg), SpO2 100 %, unknown if currently breastfeeding.   Physical  Exam Vitals and nursing note reviewed.  Constitutional:      General: She is not in acute distress.    Appearance: She is not diaphoretic.  HENT:     Head: Normocephalic and atraumatic.     Mouth/Throat:     Mouth: Mucous membranes are moist.     Pharynx: Oropharynx is clear.  Eyes:     General: No scleral icterus.    Extraocular Movements: Extraocular movements intact.     Conjunctiva/sclera: Conjunctivae normal.     Pupils: Pupils are equal, round, and reactive to light.     Comments: Brown eyes.  Cardiovascular:     Rate and Rhythm: Normal rate and regular rhythm.     Heart sounds: Normal heart sounds. No murmur heard.   Pulmonary:     Effort: Pulmonary effort is normal. No respiratory distress.     Breath sounds: Normal breath sounds. No wheezing or rales.  Chest:     Chest wall: No tenderness.  Abdominal:     General: Bowel sounds are normal. There is no distension.     Palpations: Abdomen is soft. There is no mass.     Tenderness: There is no abdominal tenderness. There is no guarding or rebound.  Musculoskeletal:        General: No swelling or tenderness. Normal range of motion.     Cervical back: Normal range of motion and neck supple.  Lymphadenopathy:     Head:  Right side of head: No preauricular, posterior auricular or occipital adenopathy.     Left side of head: No preauricular, posterior auricular or occipital adenopathy.     Cervical: No cervical adenopathy.     Upper Body:     Right upper body: No supraclavicular or axillary adenopathy.     Left upper body: No supraclavicular or axillary adenopathy.     Lower Body: No right inguinal adenopathy. No left inguinal adenopathy.  Skin:    General: Skin is warm and dry.  Neurological:     Mental Status: She is alert and oriented to person, place, and time.  Psychiatric:        Behavior: Behavior normal.        Thought Content: Thought content normal.        Judgment: Judgment normal.    No visits with  results within 3 Day(s) from this visit.  Latest known visit with results is:  Office Visit on 11/12/2019  Component Date Value Ref Range Status  . WBC 11/12/2019 8.7  3.4 - 10.8 x10E3/uL Final  . RBC 11/12/2019 2.67* 3.77 - 5.28 x10E6/uL Final  . Hemoglobin 11/12/2019 8.1* 11.1 - 15.9 g/dL Final  . Hematocrit 16/07/3708 24.5* 34.0 - 46.6 % Final  . MCV 11/12/2019 92  79 - 97 fL Final  . MCH 11/12/2019 30.3  26.6 - 33.0 pg Final  . MCHC 11/12/2019 33.1  31 - 35 g/dL Final  . RDW 62/69/4854 15.7* 11.7 - 15.4 % Final  . Platelets 11/12/2019 216  150 - 450 x10E3/uL Final  . Neutrophils 11/12/2019 73  Not Estab. % Final  . Lymphs 11/12/2019 14  Not Estab. % Final  . Monocytes 11/12/2019 8  Not Estab. % Final  . Eos 11/12/2019 0  Not Estab. % Final  . Basos 11/12/2019 1  Not Estab. % Final  . Neutrophils Absolute 11/12/2019 6.4  1.40 - 7.00 x10E3/uL Final  . Lymphocytes Absolute 11/12/2019 1.3  0 - 3 x10E3/uL Final  . Monocytes Absolute 11/12/2019 0.7  0 - 0 x10E3/uL Final  . EOS (ABSOLUTE) 11/12/2019 0.0  0.0 - 0.4 x10E3/uL Final  . Basophils Absolute 11/12/2019 0.1  0 - 0 x10E3/uL Final  . Immature Granulocytes 11/12/2019 4  Not Estab. % Final  . Immature Grans (Abs) 11/12/2019 0.3* 0.0 - 0.1 x10E3/uL Final   Comment: (An elevated percentage of Immature Granulocytes has not been found to be clinically significant as a sole clinical predictor of disease. Does NOT include bands or blast cells.  Pregnancy associated physiological leukocytosis may also show increased immature granulocytes without clinical significance.)   . NRBC 11/12/2019 1* 0 - 0 % Final    Assessment:  Tinika S Grist is a 19 y.o. female currentlyday 87 s/pdelivery withmassive postpartum hemorrhage.She has no history of any bleeding diathesis. Menses pre-pregnancy was unremarkable. She denies any use of aspirin, ibuprofen or herbal products. She has no family history of any bleeding disorders.  She has  undergonebakriballoon x2, vaginal exploration,and curettage x2.She is undergone uterine arteryembolization(left and right) on 11/05/2019. Folate was18.9 on 11/07/2019. She has received9 units of PRBCs, 6 units of FFPand 1 unit of platelets.Excessive bleedinghas resolved; she has minimal postpartum bleedingper patient.  Peripheral smearon 11/06/2019 revealed a normocytic anemia with circulting nucleated RBCs. Schistocytes not identified. There was mild leukocytosis with left shift and thrombocytopenia.  Symptomatically, she feels "good."  She has been having spotting since discharge. Leg swelling has resolved.  Plan: 1.   Labs today:  CBC with diff, ferritin, iron studies, B12, PT, PTT, von Willebrand panel, PFA. 2.   Post-partum hemorrhage Etiology remains unclear. No evidence diffuse or focal uterine atony. There was no evidence of trauma with laceration or incisions. There were no retained products of conception. Historydoes not suggest an underlyingcoagulopathy asshe had normalmenseswith no history of excess bruising or bleeding. She does not take any aspirin or ibuprofen.              PT and PTT were normal on 11/07/2019. Fibrinogen was 461.  Patient notes mild vaginal bleeding (anticipated s/p delivery).  Discuss plan for additional testing today.  Review plan for possible referral to [facility] benign hematology clinic if etiology elusive after intensive testing. 3.   Normocytic anemia  Hematocrit 26.8.  Hemoglobin 8.6.  MCV 86.7 on 11/26/2019.   Ferritin 77 with an iron saturation of 8% (low) and a TIBC of 294.  Etiology felt secondary to postpartum hemorrhage.    Continue oral iron twice daily.  4.   Thrombocytopenia, resolved  Platelet count 337,000 today.  Thrombocytopenia in hospital secondary  to consumption from excess bleeding. 5.B12 deficiency             B12 level was 296 on 11/07/2019.             B12 goal is 400.             She is on oral B12.             Check B12 level today. 6.   Lower extremity edema Peripheral edema has resolved. 7.   RTC in 7-10 days for MD assessment (virtual) and review of work-up.  I discussed the assessment and treatment plan with the patient.  The patient was provided an opportunity to ask questions and all were answered.  The patient agreed with the plan and demonstrated an understanding of the instructions.  The patient was advised to call back if the symptoms worsen or if the condition fails to improve as anticipated.   Benedicta Sultan C. Merlene Pulling, MD, PhD    11/26/2019, 12:00 PM  I, Danella Penton Tufford, am acting as Neurosurgeon for [employer]. Merlene Pulling, MD, PhD.  I, Leyton Magoon C. Merlene Pulling, MD, have reviewed the above documentation for accuracy and completeness, and I agree with the above.

## 2019-11-26 ENCOUNTER — Inpatient Hospital Stay
Payer: No Typology Code available for payment source | Attending: Hematology and Oncology | Admitting: Hematology and Oncology

## 2019-11-26 ENCOUNTER — Encounter: Payer: Self-pay | Admitting: Hematology and Oncology

## 2019-11-26 ENCOUNTER — Other Ambulatory Visit: Payer: Self-pay

## 2019-11-26 ENCOUNTER — Inpatient Hospital Stay: Payer: No Typology Code available for payment source

## 2019-11-26 DIAGNOSIS — D500 Iron deficiency anemia secondary to blood loss (chronic): Secondary | ICD-10-CM | POA: Diagnosis present

## 2019-11-26 DIAGNOSIS — D649 Anemia, unspecified: Secondary | ICD-10-CM

## 2019-11-26 DIAGNOSIS — E538 Deficiency of other specified B group vitamins: Secondary | ICD-10-CM | POA: Insufficient documentation

## 2019-11-26 LAB — IRON AND TIBC
Iron: 24 ug/dL — ABNORMAL LOW (ref 28–170)
Saturation Ratios: 8 % — ABNORMAL LOW (ref 10.4–31.8)
TIBC: 294 ug/dL (ref 250–450)
UIBC: 270 ug/dL

## 2019-11-26 LAB — CBC WITH DIFFERENTIAL/PLATELET
Abs Immature Granulocytes: 0.03 10*3/uL (ref 0.00–0.07)
Basophils Absolute: 0 10*3/uL (ref 0.0–0.1)
Basophils Relative: 1 %
Eosinophils Absolute: 0 10*3/uL (ref 0.0–0.5)
Eosinophils Relative: 0 %
HCT: 26.8 % — ABNORMAL LOW (ref 36.0–46.0)
Hemoglobin: 8.6 g/dL — ABNORMAL LOW (ref 12.0–15.0)
Immature Granulocytes: 1 %
Lymphocytes Relative: 19 %
Lymphs Abs: 1.2 10*3/uL (ref 0.7–4.0)
MCH: 27.8 pg (ref 26.0–34.0)
MCHC: 32.1 g/dL (ref 30.0–36.0)
MCV: 86.7 fL (ref 80.0–100.0)
Monocytes Absolute: 0.6 10*3/uL (ref 0.1–1.0)
Monocytes Relative: 10 %
Neutro Abs: 4.4 10*3/uL (ref 1.7–7.7)
Neutrophils Relative %: 69 %
Platelets: 337 10*3/uL (ref 150–400)
RBC: 3.09 MIL/uL — ABNORMAL LOW (ref 3.87–5.11)
RDW: 15.9 % — ABNORMAL HIGH (ref 11.5–15.5)
WBC: 6.3 10*3/uL (ref 4.0–10.5)
nRBC: 0 % (ref 0.0–0.2)

## 2019-11-26 LAB — PROTIME-INR
INR: 1 (ref 0.8–1.2)
Prothrombin Time: 13 seconds (ref 11.4–15.2)

## 2019-11-26 LAB — VITAMIN B12: Vitamin B-12: 2020 pg/mL — ABNORMAL HIGH (ref 180–914)

## 2019-11-26 LAB — FERRITIN: Ferritin: 77 ng/mL (ref 11–307)

## 2019-11-27 LAB — VON WILLEBRAND PANEL
Coagulation Factor VIII: 436 % — ABNORMAL HIGH (ref 56–140)
Ristocetin Co-factor, Plasma: 124 % (ref 50–200)
Von Willebrand Antigen, Plasma: 181 % (ref 50–200)

## 2019-11-27 LAB — COAG STUDIES INTERP REPORT

## 2019-12-03 ENCOUNTER — Encounter: Payer: Self-pay | Admitting: Hematology and Oncology

## 2019-12-03 NOTE — Progress Notes (Signed)
No new changes noted today. The patient Name and DOB has been verified by phone today. 

## 2019-12-03 NOTE — Progress Notes (Signed)
[facility]  [address] [place], [place] [address] Phone: [phone]  Fax: [phone]    Telemedicine Office Visit:  12/04/2019  Referring physician: No ref. provider found  I connected with Summit S Bicking on 12/04/2019 at 2:38 PM by videoconferencing and verified that I was speaking with the correct person using 2 identifiers.  The patient was at home.  I discussed the limitations, risk, security and privacy concerns of performing an evaluation and management service by videoconferencing and the availability of in person appointments.  I also discussed with the patient that there may be a patient responsible charge related to this service.  The patient expressed understanding and agreed to proceed.   Chief Complaint: Kathleen Barnes is a 19 y.o. female with a history of massive postpartum hemorrhage after vaginal delivery who is seen for 1 week assessment and review of work-up.  HPI: The patient was last seen in the hematology clinic on 11/26/2019 for new patient assessment. At that time, she felt "good".  She described mild vaginal spotting.  Lower extremity swelling had resolved.  She was on oral iron BID and prenatal vitamins.  Work-up revealed a hematocrit of 26.8, hemoglobin 8.6, MCV 86.7, platelets 337,000, WBC 6,300. Ferritin was 77 with an iron saturation of 8% and a TIBC of 294. Vitamin B12 was 2,020. PT was 13.0/INR 1.0.  Von Willebrand panel was normal with a factor VIII of 436%, von Willebrand antigen of 181%, and a ristocetin cofactor of 124%.  CBC has been followed: 11/09/2019:  Hematocrit 19.5. Hemoglobin 6.7.  MCV 89.0.  Platelets   96,000.  WBC 8800. 11/12/2019:  Hematocrit 24.5. Hemoglobin 8.1.  MCV 82.0.  Platelets 216,000.  WBC 8700. 11/26/2019:  Hematocrit 26.8. Hemoglobin 8.6.  MCV 86.7.  Platelets 337,000.  WBC 6300.  During the interim, she has been "good." Her vaginal spotting has resolved and she denies any bleeding. Her  appetite has improved. She denies any new symptoms or complaints. Her headaches have resolved. She is sleeping better and her baby is doing well.  She is taking oral iron BID and prenatal vitamins. She is not sure how much vitamin B12 she is taking. She is no longer taking ibuprofen.   Past Medical History:  Diagnosis Date  . Gestational diabetes   . History of chlamydia infection 10/16/2017    Past Surgical History:  Procedure Laterality Date  . ABDOMINAL HYSTERECTOMY N/A 11/05/2019   Procedure: exam under anesthesia, bakri placement, endometrial curretage;  Surgeon: Linzie Collin, MD;  Location: [facility] ORS;  Service: Gynecology;  Laterality: N/A;  . EMBOLIZATION N/A 11/05/2019   Procedure: EMBOLIZATION;  Surgeon: Annice Needy, MD;  Location: [facility] INVASIVE CV LAB;  Service: Cardiovascular;  Laterality: N/A;  . no surgical history      Family History  Problem Relation Age of Onset  . Hypertension Mother   . Healthy Father   . Heart attack Maternal Grandmother   . Migraines Maternal Grandmother     Social History:  reports that she has never smoked. She has never used smokeless tobacco. She reports that she does not drink alcohol and does not use drugs. He baby's name is Psychologist, prison and probation services. The patient is alone today.  Participants in the patient's visit and their role in the encounter included the patient and Bronwen Betters, CMA, today.  The intake visit was provided by Bronwen Betters, CMA.  Allergies: No Known Allergies  Current Medications: Current Outpatient Medications  Medication Sig Dispense Refill  .  ferrous sulfate 325 (65 FE) MG tablet Take 1 tablet (325 mg total) by mouth 2 (two) times daily with a meal. 60 tablet 1  . Prenatal Vit-Fe Fumarate-FA (MULTIVITAMIN-PRENATAL) 27-0.8 MG TABS tablet Take 1 tablet by mouth daily at 12 noon.     No current facility-administered medications for this visit.    Review of Systems  Constitutional: Negative for chills, diaphoresis,  fever, malaise/fatigue and weight loss (no new weight).       Feels "good."  HENT: Negative for congestion, ear discharge, ear pain, hearing loss, nosebleeds, sinus pain, sore throat and tinnitus.   Eyes: Negative for blurred vision.  Respiratory: Negative for cough, hemoptysis, sputum production and shortness of breath.   Cardiovascular: Negative for chest pain, palpitations and leg swelling.  Gastrointestinal: Negative for abdominal pain, blood in stool, constipation, diarrhea, heartburn, melena, nausea and vomiting.       Eating iron-rich foods for dinner. Appetite improved.  Genitourinary: Negative for dysuria, frequency, hematuria and urgency.       Vaginal spotting has resolved.  Musculoskeletal: Negative for back pain, joint pain, myalgias and neck pain.  Skin: Negative for itching and rash.  Neurological: Negative for dizziness, tingling, sensory change, weakness and headaches.  Endo/Heme/Allergies: Does not bruise/bleed easily.  Psychiatric/Behavioral: Negative for depression and memory loss. The patient is not nervous/anxious and does not have insomnia.   All other systems reviewed and are negative.   Performance status (ECOG): 0  Vitals unknown if currently breastfeeding.   Physical Exam Vitals and nursing note reviewed.  Constitutional:      General: She is not in acute distress.    Appearance: She is not diaphoretic.  Eyes:     General: No scleral icterus.    Conjunctiva/sclera: Conjunctivae normal.  Neurological:     Mental Status: She is alert and oriented to person, place, and time.  Psychiatric:        Behavior: Behavior normal.        Thought Content: Thought content normal.        Judgment: Judgment normal.     No visits with results within 3 Day(s) from this visit.  Latest known visit with results is:  Clinical Support on 11/26/2019  Component Date Value Ref Range Status  . Prothrombin Time 11/26/2019 13.0  11.4 - 15.2 seconds Final  . INR 11/26/2019  1.0  0.8 - 1.2 Final   Comment: (NOTE) INR goal varies based on device and disease states. Performed at [facility], [address], [place], [place] [address]   . Coagulation Factor VIII 11/26/2019 436* 56 - 140 % Final   Comment: (NOTE) FVIII activity can increase in a variety of clinical situations including normal pregnancy, in samples drawn from patients (particularly children) who are visibly stressed at the time of phlebotomy, as acute phase reactants, or in response to certain drug therapies such as DDAVP.  Persistently elevated FVIII activity is a risk factor for venous thrombosis as well as recurrence of venous thrombosis.  Risk is graded and increases with the degree of elevation.  Although elevated FVIII activity has been identified to cluster within families, a genetic basis for the elevation has not yet been elucidated (Br J Haematol. 2012; 536:644-034).   . Ristocetin Co-factor, Plasma 11/26/2019 124  50 - 200 % Final   Comment: (NOTE) Performed At: [facility] [address] [place], [place] [id number] Jolene Schimke MD VF:[id number]   . Von Willebrand Antigen, Plasma 11/26/2019 181  50 - 200 %  Final   Comment: (NOTE) This test was developed and its performance characteristics determined by [facility]. It has not been cleared or approved by the Food and Drug Administration.   . Vitamin B-12 11/26/2019 2,020* 180 - 914 pg/mL Final   Comment: (NOTE) This assay is not validated for testing neonatal or myeloproliferative syndrome specimens for Vitamin B12 levels. Performed at [facility], [address] [place], [place] [address]   . Iron 11/26/2019 24* 28 - 170 ug/dL Final  . TIBC 76/19/5093 294  250 - 450 ug/dL Final  . Saturation Ratios 11/26/2019 8* 10.4 - 31.8 % Final  . UIBC 11/26/2019 270  ug/dL Final   Performed at [facility], [address] [place], [place] [address]  . Ferritin 11/26/2019 77  11 - 307 ng/mL Final    Performed at [facility], [address] [place], [place] [address]  . WBC 11/26/2019 6.3  4.0 - 10.5 K/uL Final  . RBC 11/26/2019 3.09* 3.87 - 5.11 MIL/uL Final  . Hemoglobin 11/26/2019 8.6* 12.0 - 15.0 g/dL Final  . HCT 98/33/8250 26.8* 36 - 46 % Final  . MCV 11/26/2019 86.7  80.0 - 100.0 fL Final  . MCH 11/26/2019 27.8  26.0 - 34.0 pg Final  . MCHC 11/26/2019 32.1  30.0 - 36.0 g/dL Final  . RDW 53/97/6734 15.9* 11.5 - 15.5 % Final  . Platelets 11/26/2019 337  150 - 400 K/uL Final  . nRBC 11/26/2019 0.0  0.0 - 0.2 % Final  . Neutrophils Relative % 11/26/2019 69  % Final  . Neutro Abs 11/26/2019 4.4  1.7 - 7.7 K/uL Final  . Lymphocytes Relative 11/26/2019 19  % Final  . Lymphs Abs 11/26/2019 1.2  0.7 - 4.0 K/uL Final  . Monocytes Relative 11/26/2019 10  % Final  . Monocytes Absolute 11/26/2019 0.6  0.1 - 1.0 K/uL Final  . Eosinophils Relative 11/26/2019 0  % Final  . Eosinophils Absolute 11/26/2019 0.0  0.0 - 0.5 K/uL Final  . Basophils Relative 11/26/2019 1  % Final  . Basophils Absolute 11/26/2019 0.0  0.0 - 0.1 K/uL Final  . Immature Granulocytes 11/26/2019 1  % Final  . Abs Immature Granulocytes 11/26/2019 0.03  0.00 - 0.07 K/uL Final   Performed at [facility], [address], [place], [place] [address]  . Interpretation 11/26/2019 Note   Final   Comment: (NOTE) ------------------------------- COAGULATION: VON WILLEBRAND FACTOR ASSESSMENT CURRENT RESULTS ASSESSMENT The VWF:Ag is normal. The VWF:RCo is normal. The FVIII is elevated. VON WILLEBRAND FACTOR ASSESSMENT CURRENT RESULTS INTERPRETATION - These results are not consistent with a diagnosis of VWD according to the current NHLBI guideline. Persistently elevated FVIII activity is a risk factor for venous thrombosis as well as recurrence of venous thrombosis. Risk is graded and increases with the degree of elevation. Although elevated FVIII activity has been identified to cluster within  families, a genetic basis for the elevation has not yet been elucidated (Br J Haematol. 2012; 157(6):653-663). VON WILLEBRAND FACTOR ASSESSMENT - Results may be falsely elevated and possibly falsely normal as VWF and FVIII may increase in pregnancy, in samples drawn from patients (particularly children) who are visibly stressed at the time of phlebotomy, as acute phase reactants                          , or in response to certain drug therapies such as desmopressin. Repeat testing may be necessary  before excluding a diagnosis of VWD especially if the clinical suspicion is high for an underlying bleeding disorder. The setting for phlebotomy should be as calm as possible and patients should be encouraged to sit quietly prior to the blood draw. VON WILLEBRAND FACTOR ASSESSMENT DEFINITIONS - VWD - von Willebrand disease; VWF - von Willebrand factor; VWF:Ag - VWF antigen; VWF:RCo - VWF ristocetin cofactor activity; FVIII - factor VIII activity. MEDICAL DIRECTOR: For questions regarding panel interpretation, please contact Lebron Conners, M.D. at [facility] at [phone]. ------------------------------- DISCLAIMER These assessments and interpretations are provided as a convenience in support of the physician-patient relationship and are not intended to replace the physician's clinical judgment. They are derived from national guidelines in addition                           to other evidence and expert opinion. The clinician should consider this information within the context of clinical opinion and the individual patient. SEE GUIDANCE FOR VON WILLEBRAND FACTOR ASSESSMENT: (1) The National Heart, Lung and Blood Institute. The Diagnosis, Evaluation and Management of von Willebrand Disease. Lafe Garin, MD: [employer] Publication [phone]) [phone]. 2007. Available at http://kemp.com/. (2) Annie Sable et al. Othella Boyer J Hematol. 2009;  84(6):366-370. (3) Laffan M et al. Haemophilia. 2004;10(3):199-217. (4) Pasi KJ et al. Haemophilia. 2004; 10(3):218-231. Performed At: [facility] [address] [place], [place] [id number] Doroteo Bradford MD [id number]     Assessment:  Kathleen Barnes is a 19 y.o. female currentlyday 110 s/pdelivery withmassive postpartum hemorrhage.She has no history of any bleeding diathesis. Menses pre-pregnancy was unremarkable. She denies any use of aspirin, ibuprofen or herbal products. She has no family history of any bleeding disorders.  She has undergonebakriballoon x2, vaginal exploration,and curettage x2.She is undergone uterine arteryembolization(left and right) on 11/05/2019.  She has received9 units of PRBCs, 6 units of FFPand 1 unit of platelets.Excessive bleedinghas resolved; she has minimal postpartum bleedingper patient.  Peripheral smearon 11/06/2019 revealed a normocytic anemia with circulting nucleated RBCs. Schistocytes not identified. There was mild leukocytosis with left shift and thrombocytopenia.  Work-up on 11/26/2019 revealed a hematocrit of 26.8, hemoglobin 8.6, MCV 86.7, platelets 337,000, WBC 6,300. Ferritin was 77 with an iron saturation of 8% and a TIBC of 294. Vitamin B12 was 2,020. PT was 13.0/INR 1.0.  Von Willebrand panel was normal (factor VIII 436%, von Willebrand antigen 181%, ristocetin cofactor 124%).  Symptomatically, she has felt "good".  Her vaginal spotting has resolved; she denies any bleeding.  She denies any new symptoms or concerns.  Plan: 1.   Review work-up. 2.   Post-partum hemorrhage Etiology is unclear.  There was no evidence of trauma with laceration or incisions. There were no retained products of conception. Historydoes not suggest an underlyingcoagulopathy asshe had normalmenseswith no history of excess bruising  or bleeding. She deniedany use of aspirin, ibuprofen or herbal products. Bleeding diathesis work-up is negative to date.              PT and PTT were normal on 11/07/2019. von Willebrand panel is normal.  She is off ibuprofen.   Discuss plan to check platelet function assay (PFA) with next lab draw.  If work-up negative, discuss plan to refer patient to the benign hematology clinic at [facility] for additional testing:   Full platelet aggregation studies, electron microscopy of platelets, alpha-2 antiplasmin activity. 3.   Normocytic anemia  Hematocrit 26.8.  Hemoglobin 8.6.  MCV 86.7 on 11/26/2019.                         Ferritin 77 with an iron saturation of 8% (low) and a TIBC of 294.             Ferritin goal 100.  Continue oral iron.  5.B12 deficiency B12 level was 296 on 11/07/2019 and 2020 today. B12 goal is 400. She is unsure how much B12 she is taking.   Discuss need to decrease oral B12.  Patient to contact clinic regarding supplementation. 6.   RTC in 2 months MD assessment and labs (CBC with diff, ferritin, iron studies, B12, PTT, platelet function assay- to be done 1 week prior). 7.   RN to call patient 1 week prior to lab draw to ensure she does NOT take aspirin or ibuprofen.  I discussed the assessment and treatment plan with the patient.  The patient was provided an opportunity to ask questions and all were answered.  The patient agreed with the plan and demonstrated an understanding of the instructions.  The patient was advised to call back if the symptoms worsen or if the condition fails to improve as anticipated.  I provided 12 minutes (2:37 PM - 2:49 PM) of face-to-face video visit time during this this encounter and > 50% was spent counseling as documented under my assessment and plan.  [facility].  An additional 5  minutes were spent reviewing her chart (Epic and Care Everywhere) including notes, labs, and imaging studies.    Harden Bramer C. Merlene Pullingorcoran, MD, PhD    12/04/2019, 2:38 PM  I, Danella PentonEmily J Tufford, am acting as Neurosurgeonscribe for General MotorsMelissa C. Merlene Pullingorcoran, MD, PhD.  I, Tyshawn Keel C. Merlene Pullingorcoran, MD, have reviewed the above documentation for accuracy and completeness, and I agree with the above.

## 2019-12-04 ENCOUNTER — Inpatient Hospital Stay
Payer: No Typology Code available for payment source | Attending: Hematology and Oncology | Admitting: Hematology and Oncology

## 2019-12-04 ENCOUNTER — Encounter: Payer: Self-pay | Admitting: Hematology and Oncology

## 2019-12-04 DIAGNOSIS — E538 Deficiency of other specified B group vitamins: Secondary | ICD-10-CM | POA: Diagnosis not present

## 2019-12-04 DIAGNOSIS — D649 Anemia, unspecified: Secondary | ICD-10-CM

## 2019-12-10 ENCOUNTER — Other Ambulatory Visit: Payer: Self-pay

## 2019-12-10 ENCOUNTER — Ambulatory Visit (INDEPENDENT_AMBULATORY_CARE_PROVIDER_SITE_OTHER): Payer: No Typology Code available for payment source | Admitting: Obstetrics and Gynecology

## 2019-12-10 ENCOUNTER — Encounter: Payer: Self-pay | Admitting: Obstetrics and Gynecology

## 2019-12-10 DIAGNOSIS — Z30011 Encounter for initial prescription of contraceptive pills: Secondary | ICD-10-CM

## 2019-12-10 DIAGNOSIS — Z3009 Encounter for other general counseling and advice on contraception: Secondary | ICD-10-CM

## 2019-12-10 MED ORDER — DESOGESTREL-ETHINYL ESTRADIOL 0.15-0.02/0.01 MG (21/5) PO TABS: 1.0000 | ORAL_TABLET | Freq: Every day | ORAL | 0 refills | Status: DC

## 2019-12-10 NOTE — Progress Notes (Signed)
HPI:      [name] is a 19 y.o. G1P1001 who LMP was No LMP recorded.  Subjective:   She presents today she reports today for a postpartum exam.  She reports that she is having minimal bleeding-occasional brown spotting.  She is bottlefeeding.  She desires birth control. She continues to take prenatal vitamins and iron as previously directed.    Hx: The following portions of the patient's history were reviewed and updated as appropriate:             She  has a past medical history of Gestational diabetes and History of chlamydia infection (10/16/2017). She does not have any pertinent problems on file. She  has a past surgical history that includes no surgical history and EMBOLIZATION (N/A, 11/05/2019). Her family history includes Healthy in her father; Heart attack in her maternal grandmother; Hypertension in her mother; Migraines in her maternal grandmother. She  reports that she has never smoked. She has never used smokeless tobacco. She reports that she does not drink alcohol and does not use drugs. She has a current medication list which includes the following prescription(s): ferrous sulfate, multivitamin-prenatal, and desogestrel-ethinyl estradiol. She has No Known Allergies.       Review of Systems:  Review of Systems  Constitutional: Denied constitutional symptoms, night sweats, recent illness, fatigue, fever, insomnia and weight loss.  Eyes: Denied eye symptoms, eye pain, photophobia, vision change and visual disturbance.  Ears/Nose/Throat/Neck: Denied ear, nose, throat or neck symptoms, hearing loss, nasal discharge, sinus congestion and sore throat.  Cardiovascular: Denied cardiovascular symptoms, arrhythmia, chest pain/pressure, edema, exercise intolerance, orthopnea and palpitations.  Respiratory: Denied pulmonary symptoms, asthma, pleuritic pain, productive sputum, cough, dyspnea and wheezing.  Gastrointestinal: Denied, gastro-esophageal reflux, melena, nausea and  vomiting.  Genitourinary: Denied genitourinary symptoms including symptomatic vaginal discharge, pelvic relaxation issues, and urinary complaints.  Musculoskeletal: Denied musculoskeletal symptoms, stiffness, swelling, muscle weakness and myalgia.  Dermatologic: Denied dermatology symptoms, rash and scar.  Neurologic: Denied neurology symptoms, dizziness, headache, neck pain and syncope.  Psychiatric: Denied psychiatric symptoms, anxiety and depression.  Endocrine: Denied endocrine symptoms including hot flashes and night sweats.   Meds:   Current Outpatient Medications on File Prior to Visit  Medication Sig Dispense Refill  . ferrous sulfate 325 (65 FE) MG tablet Take 1 tablet (325 mg total) by mouth 2 (two) times daily with a meal. 60 tablet 1  . Prenatal Vit-Fe Fumarate-FA (MULTIVITAMIN-PRENATAL) 27-0.8 MG TABS tablet Take 1 tablet by mouth daily at 12 noon.     No current facility-administered medications on file prior to visit.       Upstream - 12/10/19 1002      Pregnancy Intention Screening   Does the patient want to become pregnant in the next year? No    Does the patient's partner want to become pregnant in the next year? No    Would the patient like to discuss contraceptive options today? Yes      Contraception Wrap Up   Current Method No Method - Other Reason    Contraception Counseling Provided Yes          The pregnancy intention screening data noted above was reviewed. Potential methods of contraception were discussed. The patient elected to proceed with Oral Contraceptive.     Objective:     Vitals:   12/10/19 1001  BP: 122/84  Pulse: 91   Filed Weights   12/10/19 1001  Weight: 153 lb 11.2 oz (69.7 kg)  Pelvic examination   Pelvic:   Vulva: Normal appearance.  No lesions.  No abnormal scarring.    Vagina: No lesions or abnormalities noted.  Support: Normal pelvic support.  Urethra No masses tenderness or scarring.  Meatus Normal size  without lesions or prolapse.  Cervix: Normal ectropion.  No lesions.  Anus: Normal exam.  No lesions.  Perineum: Normal exam.  No lesions.  Healed well.          Bimanual   Uterus: Normal size.  Non-tender.  Mobile.  AV.  Adnexae: No masses.  Non-tender to palpation.  Cul-de-sac: Negative for abnormality.     Assessment:    G1P1001 Patient Active Problem List   Diagnosis Date Noted  . Anemia 11/26/2019  . B12 deficiency 11/26/2019  . Acute blood loss anemia 11/08/2019  . Normal labor 11/04/2019  . PPH (postpartum hemorrhage) 11/04/2019  . Abnormal glucose tolerance test (GTT) 09/12/2019  . Marginal insertion of umbilical cord affecting management of mother 07/17/2019  . Echogenic intracardiac focus of fetus on prenatal ultrasound 07/17/2019  . High risk teen pregnancy, antepartum 05/16/2019  . Maternal varicella, non-immune 05/16/2019  . History of chlamydia infection 10/16/2017     1. Postpartum care and examination immediately after delivery   2. Postpartum hemorrhage, unspecified type   3. Birth control counseling   4. Initiation of OCP (BCP)     She is doing well at this time.  No issues.   Plan:            1.  Patient may resume normal activities with exception of heavy lifting.   2.  Birth Control I discussed multiple birth control options and methods with the patient.  The risks and benefits of each were reviewed.  She has chosen OCPs.   OCPs The risks /benefits of OCPs have been explained to the patient in detail.  Product literature has been given to her where appropriate.  I have instructed her in the use of OCPs.  I have explained to the patient that OCPs are not as effective for birth control during the first month of use, and that another form of contraception should be used during this time.  Both first-day start and Sunday start have been explained.  The risks and benefits of each was discussed.  She has been made aware of  the fact that in rare  circumstances, other medications may affect the efficacy of OCPs.  I have answered all of her questions, and I believe that she has an understanding of the effectiveness and use of OCPs.  3.  Recommend blood dyscrasia testing 3 months postpartum.  Orders No orders of the defined types were placed in this encounter.    Meds ordered this encounter  Medications  . desogestrel-ethinyl estradiol (MIRCETTE) 0.15-0.02/0.01 MG (21/5) tablet    Sig: Take 1 tablet by mouth at bedtime.    Dispense:  84 tablet    Refill:  0      F/U  Return in about 2 months (around 02/09/2020).  Elonda Husky, M.D. 12/10/2019 10:28 AM

## 2020-01-29 ENCOUNTER — Inpatient Hospital Stay: Payer: No Typology Code available for payment source | Attending: Hematology and Oncology

## 2020-02-04 ENCOUNTER — Telehealth: Payer: Self-pay | Admitting: Hematology and Oncology

## 2020-02-04 NOTE — Progress Notes (Incomplete)
[facility]  [address] [place], [place] [address] Phone: [phone]  Fax: [phone]    Clinic Day:  02/04/2020  Referring physician: No ref. provider found  Chief Complaint: Kathleen Barnes is a 20 y.o. female with a history of massive postpartum hemorrhage after vaginal delivery who is seen for 2 month assessment.  HPI: The patient was last seen in the hematology clinic on 12/04/2019 via telemedicine. At that time, she felt "good". Her vaginal spotting had resolved; she denied any bleeding. She denied any new symptoms or concerns. Hematocrit was 26.8, hemoglobin 8.6, MCV 86.7, platelets 337,000, WBC 6300 (ANC 4400).  Ferritin was 77 with an iron saturation of 8% and a TIBC 294.  B12 was 2020.  Oral iron was continued.  She was to call and let us know how much vitamin B12 she was taking for planned adjustment in dose.  During the interim, ***   Past Medical History:  Diagnosis Date  . Gestational diabetes   . History of chlamydia infection 10/16/2017    Past Surgical History:  Procedure Laterality Date  . EMBOLIZATION N/A 11/05/2019   Procedure: EMBOLIZATION;  Surgeon: Annice Needy, MD;  Location: [facility] INVASIVE CV LAB;  Service: Cardiovascular;  Laterality: N/A;  . no surgical history      Family History  Problem Relation Age of Onset  . Hypertension Mother   . Healthy Father   . Heart attack Maternal Grandmother   . Migraines Maternal Grandmother     Social History:  reports that she has never smoked. She has never used smokeless tobacco. She reports that she does not drink alcohol and does not use drugs. He baby's name is Psychologist, prison and probation services. The patient is alone today.***  Allergies: No Known Allergies  Current Medications: Current Outpatient Medications  Medication Sig Dispense Refill  . desogestrel-ethinyl estradiol (MIRCETTE) 0.15-0.02/0.01 MG (21/5) tablet Take 1 tablet by mouth at bedtime. 84 tablet 0  . ferrous sulfate 325 (65 FE) MG  tablet Take 1 tablet (325 mg total) by mouth 2 (two) times daily with a meal. 60 tablet 1  . Prenatal Vit-Fe Fumarate-FA (MULTIVITAMIN-PRENATAL) 27-0.8 MG TABS tablet Take 1 tablet by mouth daily at 12 noon.     No current facility-administered medications for this visit.    Review of Systems  Constitutional: Negative for chills, diaphoresis, fever, malaise/fatigue and weight loss (no new weight).       Feels "good."  HENT: Negative for congestion, ear discharge, ear pain, hearing loss, nosebleeds, sinus pain, sore throat and tinnitus.   Eyes: Negative for blurred vision.  Respiratory: Negative for cough, hemoptysis, sputum production and shortness of breath.   Cardiovascular: Negative for chest pain, palpitations and leg swelling.  Gastrointestinal: Negative for abdominal pain, blood in stool, constipation, diarrhea, heartburn, melena, nausea and vomiting.       Eating iron-rich foods for dinner. Appetite improved.  Genitourinary: Negative for dysuria, frequency, hematuria and urgency.       Vaginal spotting has resolved.  Musculoskeletal: Negative for back pain, joint pain, myalgias and neck pain.  Skin: Negative for itching and rash.  Neurological: Negative for dizziness, tingling, sensory change, weakness and headaches.  Endo/Heme/Allergies: Does not bruise/bleed easily.  Psychiatric/Behavioral: Negative for depression and memory loss. The patient is not nervous/anxious and does not have insomnia.   All other systems reviewed and are negative.   Performance status (ECOG): 0***  Vitals unknown if currently breastfeeding.   Physical Exam Vitals and nursing note reviewed.  Constitutional:      General: She is not in acute distress.    Appearance: She is not diaphoretic.  Eyes:     General: No scleral icterus.    Conjunctiva/sclera: Conjunctivae normal.  Neurological:     Mental Status: She is alert and oriented to person, place, and time.  Psychiatric:        Behavior:  Behavior normal.        Thought Content: Thought content normal.        Judgment: Judgment normal.     No visits with results within 3 Day(s) from this visit.  Latest known visit with results is:  Clinical Support on 11/26/2019  Component Date Value Ref Range Status  . Prothrombin Time 11/26/2019 13.0  11.4 - 15.2 seconds Final  . INR 11/26/2019 1.0  0.8 - 1.2 Final   Comment: (NOTE) INR goal varies based on device and disease states. Performed at [facility], [address] [place], [place] [address]   . Coagulation Factor VIII 11/26/2019 436* 56 - 140 % Final   Comment: (NOTE) FVIII activity can increase in a variety of clinical situations including normal pregnancy, in samples drawn from patients (particularly children) who are visibly stressed at the time of phlebotomy, as acute phase reactants, or in response to certain drug therapies such as DDAVP.  Persistently elevated FVIII activity is a risk factor for venous thrombosis as well as recurrence of venous thrombosis.  Risk is graded and increases with the degree of elevation.  Although elevated FVIII activity has been identified to cluster within families, a genetic basis for the elevation has not yet been elucidated (Br J Haematol. 2012; 546:503-546).   . Ristocetin Co-factor, Plasma 11/26/2019 124  50 - 200 % Final   Comment: (NOTE) Performed At: [facility] [address] [place], [place] [id number] Jolene Schimke MD GY:[id number]   . Von Willebrand Antigen, Plasma 11/26/2019 181  50 - 200 % Final   Comment: (NOTE) This test was developed and its performance characteristics determined by [facility]. It has not been cleared or approved by the Food and Drug Administration.   . Vitamin B-12 11/26/2019 2,020* 180 - 914 pg/mL Final   Comment: (NOTE) This assay is not validated for testing neonatal or myeloproliferative syndrome specimens for Vitamin B12 levels. Performed at [facility],  [address], [place], [place] [address]   . Iron 11/26/2019 24* 28 - 170 ug/dL Final  . TIBC 46/65/9935 294  250 - 450 ug/dL Final  . Saturation Ratios 11/26/2019 8* 10.4 - 31.8 % Final  . UIBC 11/26/2019 270  ug/dL Final   Performed at [facility], [address], [place], [place] [address]  . Ferritin 11/26/2019 77  11 - 307 ng/mL Final   Performed at [facility], [address] [place] [place], [place] [address]  . WBC 11/26/2019 6.3  4.0 - 10.5 K/uL Final  . RBC 11/26/2019 3.09* 3.87 - 5.11 MIL/uL Final  . Hemoglobin 11/26/2019 8.6* 12.0 - 15.0 g/dL Final  . HCT 00/92/3300 26.8* 36.0 - 46.0 % Final  . MCV 11/26/2019 86.7  80.0 - 100.0 fL Final  . MCH 11/26/2019 27.8  26.0 - 34.0 pg Final  . MCHC 11/26/2019 32.1  30.0 - 36.0 g/dL Final  . RDW 76/22/6333 15.9* 11.5 - 15.5 % Final  . Platelets 11/26/2019 337  150 - 400 K/uL Final  . nRBC 11/26/2019 0.0  0.0 - 0.2 % Final  . Neutrophils Relative %  11/26/2019 69  % Final  . Neutro Abs 11/26/2019 4.4  1.7 - 7.7 K/uL Final  . Lymphocytes Relative 11/26/2019 19  % Final  . Lymphs Abs 11/26/2019 1.2  0.7 - 4.0 K/uL Final  . Monocytes Relative 11/26/2019 10  % Final  . Monocytes Absolute 11/26/2019 0.6  0.1 - 1.0 K/uL Final  . Eosinophils Relative 11/26/2019 0  % Final  . Eosinophils Absolute 11/26/2019 0.0  0.0 - 0.5 K/uL Final  . Basophils Relative 11/26/2019 1  % Final  . Basophils Absolute 11/26/2019 0.0  0.0 - 0.1 K/uL Final  . Immature Granulocytes 11/26/2019 1  % Final  . Abs Immature Granulocytes 11/26/2019 0.03  0.00 - 0.07 K/uL Final   Performed at [facility], [address] [place], [place] [address]  . Interpretation 11/26/2019 Note   Final   Comment: (NOTE) ------------------------------- COAGULATION: VON WILLEBRAND FACTOR ASSESSMENT CURRENT RESULTS ASSESSMENT The VWF:Ag is normal. The VWF:RCo is normal. The FVIII is elevated. VON WILLEBRAND FACTOR ASSESSMENT CURRENT  RESULTS INTERPRETATION - These results are not consistent with a diagnosis of VWD according to the current NHLBI guideline. Persistently elevated FVIII activity is a risk factor for venous thrombosis as well as recurrence of venous thrombosis. Risk is graded and increases with the degree of elevation. Although elevated FVIII activity has been identified to cluster within families, a genetic basis for the elevation has not yet been elucidated (Br J Haematol. 2012; 157(6):653-663). VON WILLEBRAND FACTOR ASSESSMENT - Results may be falsely elevated and possibly falsely normal as VWF and FVIII may increase in pregnancy, in samples drawn from patients (particularly children) who are visibly stressed at the time of phlebotomy, as acute phase reactants                          , or in response to certain drug therapies such as desmopressin. Repeat testing may be necessary before excluding a diagnosis of VWD especially if the clinical suspicion is high for an underlying bleeding disorder. The setting for phlebotomy should be as calm as possible and patients should be encouraged to sit quietly prior to the blood draw. VON WILLEBRAND FACTOR ASSESSMENT DEFINITIONS - VWD - von Willebrand disease; VWF - von Willebrand factor; VWF:Ag - VWF antigen; VWF:RCo - VWF ristocetin cofactor activity; FVIII - factor VIII activity. MEDICAL DIRECTOR: For questions regarding panel interpretation, please contact Lebron Conners, M.D. at [facility] at [phone]. ------------------------------- DISCLAIMER These assessments and interpretations are provided as a convenience in support of the physician-patient relationship and are not intended to replace the physician's clinical judgment. They are derived from national guidelines in addition                           to other evidence and expert opinion. The clinician should consider this information within the context of clinical  opinion and the individual patient. SEE GUIDANCE FOR VON WILLEBRAND FACTOR ASSESSMENT: (1) The National Heart, Lung and Blood Institute. The Diagnosis, Evaluation and Management of von Willebrand Disease. Lafe Garin, MD: [employer] Publication [id number]. 2007. Available at http://kemp.com/. (2) Annie Sable et al. Othella Boyer J Hematol. 2009; 84(6):366-370. (3) Laffan M et al. Haemophilia. 2004;10(3):199-217. (4) Pasi KJ et al. Haemophilia. 2004; 10(3):218-231. Performed At: [facility] [address] [place], [place] [id number] Doroteo Bradford MD HQ:[id number]     Assessment:  BRYLEA PITA is a 20 y.o.  female withmassive postpartum hemorrhage on 11/04/2019.She has no history of any bleeding diathesis. Menses pre-pregnancy was unremarkable. She denied any use of aspirin, ibuprofen or herbal products. She has no family history of any bleeding disorders.  She has undergonebakriballoon x2, vaginal exploration,and curettage x2.She is undergone uterine arteryembolization(left and right) on 11/05/2019.  She has received9 units of PRBCs, 6 units of FFPand 1 unit of platelets.Excessive bleedinghas resolved; she has minimal postpartum bleedingper patient.  Peripheral smearon 11/06/2019 revealed a normocytic anemia with circulting nucleated RBCs. Schistocytes not identified. There was mild leukocytosis with left shift and thrombocytopenia.  Work-up on 11/26/2019 revealed a hematocrit of 26.8, hemoglobin 8.6, MCV 86.7, platelets 337,000, WBC 6,300. Ferritin was 77 with an iron saturation of 8% and a TIBC of 294. Vitamin B12 was 296 on 11/07/2019 and 2,020 on 11/26/2018. PT was 13.0/INR 1.0.  Von Willebrand panel was normal (factor VIII 436%, von Willebrand antigen 181%, ristocetin cofactor 124%).  Symptomatically, ***  Plan: 1.   Labs:  CBC with diff, ferritin, iron studies, B12, PTT, PFA, von Willebrand panel.   2.    Post-partum hemorrhage Etiology is unclear.  There was no evidence of trauma with laceration or incisions. There were no retained products of conception. Historydoes not suggest an underlyingcoagulopathy asshe had normalmenseswith no history of excess bruising or bleeding. She deniedany use of aspirin, ibuprofen or herbal products. Bleeding diathesis work-up is negative to date.              PT and PTT were normal on 11/07/2019. von Willebrand panel is normal.  She is off ibuprofen.   Discuss plan to check platelet function assay (PFA) with next lab draw.  If work-up negative, discuss plan to refer patient to the benign hematology clinic at [facility] for additional testing:   Full platelet aggregation studies, electron microscopy of platelets, alpha-2 antiplasmin activity. 3.   Normocytic anemia             Hematocrit 26.8.  Hemoglobin 8.6.  MCV 86.7 on 11/26/2019.                         Ferritin 77 with an iron saturation of 8% (low) and a TIBC of 294.             Ferritin goal 100.  Continue oral iron.  5.B12 deficiency B12 level was 296 on 11/07/2019 and 2020 today. B12 goal is 400. She is unsure how much B12 she is taking.   Discuss need to decrease oral B12.  Patient to contact clinic regarding supplementation. 6.   RTC in 2 months MD assessment and labs (CBC with diff, ferritin, iron studies, B12, PTT, platelet function assay- to be done 1 week prior). 7.   RN to call patient 1 week prior to lab draw to ensure she does NOT take aspirin or ibuprofen.  I discussed the assessment and treatment plan with the patient.  The patient was provided an opportunity to ask questions and all were answered.  The patient agreed with the plan and demonstrated an understanding of the instructions.  The patient  was advised to call back if the symptoms worsen or if the condition fails to improve as anticipated.  I provided *** minutes of face-to-face time during this this encounter and > 50% was spent counseling as documented under my assessment and plan.   Melissa C. Mike Gip, MD, PhD    02/04/2020, 1:06 PM  I, Mirian Mo Tufford, am acting as  scribe for Melissa C. Mike Gip, MD, PhD.  I, Melissa C. Mike Gip, MD, have reviewed the above documentation for accuracy and completeness, and I agree with the above.

## 2020-02-04 NOTE — Telephone Encounter (Signed)
Called patient to inquire about her follow up . She did not show up for labs and was going to see if she was coming for follow up or needed to reschedule.

## 2020-02-05 ENCOUNTER — Other Ambulatory Visit: Payer: Self-pay | Admitting: Hematology and Oncology

## 2020-02-05 ENCOUNTER — Inpatient Hospital Stay
Payer: No Typology Code available for payment source | Attending: Hematology and Oncology | Admitting: Hematology and Oncology

## 2020-02-05 ENCOUNTER — Telehealth: Payer: Self-pay

## 2020-02-10 ENCOUNTER — Telehealth: Payer: Self-pay

## 2020-02-10 NOTE — Telephone Encounter (Signed)
mychart message sent to patient re: no visitors 

## 2020-02-11 ENCOUNTER — Encounter: Payer: Self-pay | Admitting: Obstetrics and Gynecology

## 2020-02-11 ENCOUNTER — Other Ambulatory Visit: Payer: Self-pay

## 2020-02-11 ENCOUNTER — Ambulatory Visit (INDEPENDENT_AMBULATORY_CARE_PROVIDER_SITE_OTHER): Payer: No Typology Code available for payment source | Admitting: Obstetrics and Gynecology

## 2020-02-11 VITALS — BP 138/88 | HR 110 | Ht 60.0 in | Wt 159.1 lb

## 2020-02-11 DIAGNOSIS — D699 Hemorrhagic condition, unspecified: Secondary | ICD-10-CM

## 2020-02-11 DIAGNOSIS — Z3009 Encounter for other general counseling and advice on contraception: Secondary | ICD-10-CM

## 2020-02-11 NOTE — Progress Notes (Signed)
HPI:      [name] is a 20 y.o. G1P1001 who LMP was Patient's last menstrual period was 02/06/2020.  Subjective:   She presents today to follow-up of her OCPs.  She has been taking as directed.  She has had 2 cycles without issue.  She would like to continue them for birth control.  She feels well recovered from her postpartum hemorrhage and sequelae. She also is here for follow-up after her postpartum hemorrhage for additional work-up for thrombophilias.    Hx: The following portions of the patient's history were reviewed and updated as appropriate:             She  has a past medical history of Gestational diabetes and History of chlamydia infection (10/16/2017). She does not have any pertinent problems on file. She  has a past surgical history that includes no surgical history and EMBOLIZATION (N/A, 11/05/2019). Her family history includes Healthy in her father; Heart attack in her maternal grandmother; Hypertension in her mother; Migraines in her maternal grandmother. She  reports that she has never smoked. She has never used smokeless tobacco. She reports that she does not drink alcohol and does not use drugs. She has a current medication list which includes the following prescription(s): desogestrel-ethinyl estradiol, ferrous sulfate, and multivitamin-prenatal. She has No Known Allergies.       Review of Systems:  Review of Systems  Constitutional: Denied constitutional symptoms, night sweats, recent illness, fatigue, fever, insomnia and weight loss.  Eyes: Denied eye symptoms, eye pain, photophobia, vision change and visual disturbance.  Ears/Nose/Throat/Neck: Denied ear, nose, throat or neck symptoms, hearing loss, nasal discharge, sinus congestion and sore throat.  Cardiovascular: Denied cardiovascular symptoms, arrhythmia, chest pain/pressure, edema, exercise intolerance, orthopnea and palpitations.  Respiratory: Denied pulmonary symptoms, asthma, pleuritic pain, productive  sputum, cough, dyspnea and wheezing.  Gastrointestinal: Denied, gastro-esophageal reflux, melena, nausea and vomiting.  Genitourinary: Denied genitourinary symptoms including symptomatic vaginal discharge, pelvic relaxation issues, and urinary complaints.  Musculoskeletal: Denied musculoskeletal symptoms, stiffness, swelling, muscle weakness and myalgia.  Dermatologic: Denied dermatology symptoms, rash and scar.  Neurologic: Denied neurology symptoms, dizziness, headache, neck pain and syncope.  Psychiatric: Denied psychiatric symptoms, anxiety and depression.  Endocrine: Denied endocrine symptoms including hot flashes and night sweats.   Meds:   Current Outpatient Medications on File Prior to Visit  Medication Sig Dispense Refill  . desogestrel-ethinyl estradiol (MIRCETTE) 0.15-0.02/0.01 MG (21/5) tablet Take 1 tablet by mouth at bedtime. 84 tablet 0  . ferrous sulfate 325 (65 FE) MG tablet Take 1 tablet (325 mg total) by mouth 2 (two) times daily with a meal. 60 tablet 1  . Prenatal Vit-Fe Fumarate-FA (MULTIVITAMIN-PRENATAL) 27-0.8 MG TABS tablet Take 1 tablet by mouth daily at 12 noon.     No current facility-administered medications on file prior to visit.       Upstream - 02/11/20 1002      Pregnancy Intention Screening   Does the patient want to become pregnant in the next year? No    Does the patient's partner want to become pregnant in the next year? No    Would the patient like to discuss contraceptive options today? No      Contraception Wrap Up   Current Method Oral Contraceptive    End Method Oral Contraceptive    Contraception Counseling Provided No          The pregnancy intention screening data noted above was reviewed. Potential methods of contraception were discussed. The  patient elected to proceed with Oral Contraceptive.     Objective:     Vitals:   02/11/20 1001  BP: 138/88  Pulse: (!) 110   Filed Weights   02/11/20 1001  Weight: 159 lb 1.6 oz  (72.2 kg)                Assessment:    G1P1001 Patient Active Problem List   Diagnosis Date Noted  . Anemia 11/26/2019  . B12 deficiency 11/26/2019  . Acute blood loss anemia 11/08/2019  . Normal labor 11/04/2019  . PPH (postpartum hemorrhage) 11/04/2019  . Abnormal glucose tolerance test (GTT) 09/12/2019  . Marginal insertion of umbilical cord affecting management of mother 07/17/2019  . Echogenic intracardiac focus of fetus on prenatal ultrasound 07/17/2019  . High risk teen pregnancy, antepartum 05/16/2019  . Maternal varicella, non-immune 05/16/2019  . History of chlamydia infection 10/16/2017     1. Birth control counseling   2. Bleeding disorder (HCC)     Birth control going well.  She would like to continue  Not sure if she has a bleeding disorder but her significant postpartum hemorrhage that was very difficult to control prompts further evaluation.   Plan:            1. work-up for bleeding disorder   PT PTT, platelets assay, von Willebrand panel. Orders No orders of the defined types were placed in this encounter.   No orders of the defined types were placed in this encounter.     F/U  Return in about 9 months (around 11/10/2020) for We will contact her with any abnormal test results. I spent 22 minutes involved in the care of this patient preparing to see the patient by obtaining and reviewing her medical history (including labs, imaging tests and prior procedures), documenting clinical information in the electronic health record (EHR), counseling and coordinating care plans, writing and sending prescriptions, ordering tests or procedures and directly communicating with the patient by discussing pertinent items from her history and physical exam as well as detailing my assessment and plan as noted above so that she has an informed understanding.  All of her questions were answered.  Elonda Husky, M.D. 02/11/2020 12:01 PM

## 2020-02-17 ENCOUNTER — Other Ambulatory Visit: Payer: No Typology Code available for payment source

## 2020-02-17 ENCOUNTER — Other Ambulatory Visit: Payer: Self-pay

## 2020-02-19 LAB — CBC WITH DIFFERENTIAL/PLATELET
Basophils Absolute: 0 10*3/uL (ref 0.0–0.2)
Basos: 1 %
EOS (ABSOLUTE): 0 10*3/uL (ref 0.0–0.4)
Eos: 0 %
Hematocrit: 37.6 % (ref 34.0–46.6)
Hemoglobin: 12.8 g/dL (ref 11.1–15.9)
Immature Grans (Abs): 0 10*3/uL (ref 0.0–0.1)
Immature Granulocytes: 0 %
Lymphocytes Absolute: 2.1 10*3/uL (ref 0.7–3.1)
Lymphs: 40 %
MCH: 26.6 pg (ref 26.6–33.0)
MCHC: 34 g/dL (ref 31.5–35.7)
MCV: 78 fL — ABNORMAL LOW (ref 79–97)
Monocytes Absolute: 0.6 10*3/uL (ref 0.1–0.9)
Monocytes: 12 %
Neutrophils Absolute: 2.5 10*3/uL (ref 1.4–7.0)
Neutrophils: 47 %
Platelets: 268 10*3/uL (ref 150–450)
RBC: 4.82 x10E6/uL (ref 3.77–5.28)
RDW: 15.6 % — ABNORMAL HIGH (ref 11.7–15.4)
WBC: 5.3 10*3/uL (ref 3.4–10.8)

## 2020-02-19 LAB — APTT: aPTT: 28 s (ref 24–33)

## 2020-02-19 LAB — VON WILLEBRAND PANEL
Factor VIII Activity: 154 % — ABNORMAL HIGH (ref 56–140)
Von Willebrand Ag: 119 % (ref 50–200)
Von Willebrand Factor: 79 % (ref 50–200)

## 2020-02-19 LAB — PROTIME-INR
INR: 0.9 (ref 0.9–1.2)
Prothrombin Time: 9.6 s (ref 9.1–12.0)

## 2020-02-19 LAB — COAG STUDIES INTERP REPORT

## 2020-02-25 ENCOUNTER — Other Ambulatory Visit: Payer: Self-pay | Admitting: Obstetrics and Gynecology

## 2020-02-25 DIAGNOSIS — R58 Hemorrhage, not elsewhere classified: Secondary | ICD-10-CM

## 2020-03-09 ENCOUNTER — Telehealth: Payer: Self-pay

## 2020-03-09 ENCOUNTER — Other Ambulatory Visit: Payer: Self-pay

## 2020-03-09 DIAGNOSIS — Z3009 Encounter for other general counseling and advice on contraception: Secondary | ICD-10-CM

## 2020-03-09 DIAGNOSIS — Z30011 Encounter for initial prescription of contraceptive pills: Secondary | ICD-10-CM

## 2020-03-09 MED ORDER — DESOGESTREL-ETHINYL ESTRADIOL 0.15-0.02/0.01 MG (21/5) PO TABS: 1.0000 | ORAL_TABLET | Freq: Every day | ORAL | 0 refills | Status: DC

## 2020-03-09 MED ORDER — FERROUS SULFATE 325 (65 FE) MG PO TABS
325.0000 mg | ORAL_TABLET | Freq: Two times a day (BID) | ORAL | 1 refills | Status: DC
Start: 2020-03-09 — End: 2020-10-11

## 2020-03-09 NOTE — Telephone Encounter (Signed)
New message   1. Which medications need to be refilled? (please list name of each medication and dose if known) birth control pills  & Ferrous sulfax   2. Which pharmacy/location (including street and city if local pharmacy) is medication to be sent to?  [id number] [place] [facility], [place] - [address]  [address] [place] [address]

## 2020-06-08 ENCOUNTER — Telehealth: Payer: Self-pay | Admitting: Obstetrics and Gynecology

## 2020-06-08 DIAGNOSIS — Z30011 Encounter for initial prescription of contraceptive pills: Secondary | ICD-10-CM

## 2020-06-08 DIAGNOSIS — Z3009 Encounter for other general counseling and advice on contraception: Secondary | ICD-10-CM

## 2020-06-08 MED ORDER — DESOGESTREL-ETHINYL ESTRADIOL 0.15-0.02/0.01 MG (21/5) PO TABS: 1.0000 | ORAL_TABLET | Freq: Every day | ORAL | 2 refills | Status: DC

## 2020-06-08 NOTE — Telephone Encounter (Signed)
New Message:   *STAT* If patient is at the pharmacy, call can be transferred to refill team.   1. Which medications need to be refilled? (please list name of each medication and dose if known) doesn't know the name she states its her birth control  2. Which pharmacy/location (including street and city if local pharmacy) is medication to be sent to? [facility] [place]  3. Do they need a 30 day or 90 day supply? 90

## 2020-06-08 NOTE — Telephone Encounter (Signed)
Prescription has been filled. I have LM for patient that it has been sent in.

## 2020-08-31 ENCOUNTER — Telehealth: Payer: No Typology Code available for payment source | Admitting: Obstetrics and Gynecology

## 2020-08-31 NOTE — Telephone Encounter (Signed)
Patient is requesting a refill of her birth control she thinks the name is Mircette.  She would [facility] [place] on [address]

## 2020-09-01 NOTE — Telephone Encounter (Signed)
Notified patient that she has 2 refills left. She needs to contact pharmacy.

## 2020-10-11 ENCOUNTER — Encounter: Payer: Self-pay | Admitting: Certified Nurse Midwife

## 2020-10-11 ENCOUNTER — Other Ambulatory Visit: Payer: Self-pay

## 2020-10-11 ENCOUNTER — Other Ambulatory Visit (HOSPITAL_COMMUNITY)
Admission: RE | Admit: 2020-10-11 | Discharge: 2020-10-11 | Disposition: A | Discharge status: Home / Self Care | Payer: No Typology Code available for payment source | Source: Ambulatory Visit | Attending: Certified Nurse Midwife | Admitting: Certified Nurse Midwife

## 2020-10-11 ENCOUNTER — Ambulatory Visit (INDEPENDENT_AMBULATORY_CARE_PROVIDER_SITE_OTHER): Payer: Self-pay | Admitting: Certified Nurse Midwife

## 2020-10-11 VITALS — BP 129/84 | HR 112 | Resp 16 | Ht 60.0 in | Wt 164.9 lb

## 2020-10-11 DIAGNOSIS — N898 Other specified noninflammatory disorders of vagina: Secondary | ICD-10-CM

## 2020-10-11 DIAGNOSIS — Z113 Encounter for screening for infections with a predominantly sexual mode of transmission: Secondary | ICD-10-CM | POA: Insufficient documentation

## 2020-10-11 MED ORDER — FERROUS SULFATE 325 (65 FE) MG PO TABS
325.0000 mg | ORAL_TABLET | Freq: Two times a day (BID) | ORAL | 5 refills | Status: DC
Start: 2020-10-11 — End: 2022-11-28

## 2020-10-11 NOTE — Addendum Note (Signed)
Addended by: Mechele Claude on: 10/11/2020 03:37 PM   Modules accepted: Orders

## 2020-10-11 NOTE — Progress Notes (Addendum)
GYN ENCOUNTER NOTE  Subjective:       Kathleen Barnes is a 20 y.o. G22P1001 female is here for gynecologic evaluation of the following issues:  1. Vaginal discharge for the past several days noticed this after her period. She has tried treating with apple cider vinegar bath. .     Gynecologic History Patient's last menstrual period was 09/28/2020 (exact date). Contraception: OCP (estrogen/progesterone) Last Pap: has not had done. Pt state she has appt with Dr. Logan Bores for annual on 13th.  Last mammogram: has not had  Obstetric History OB History  Gravida Para Term Preterm AB Living  1 1 1     1   SAB IAB Ectopic Multiple Live Births        0 1    # Outcome Date GA Lbr Len/2nd Weight Sex Delivery Anes PTL Lv  1 Term 11/04/19 [redacted]w[redacted]d / 02:04 7 lb 3.7 oz (3.28 kg) F Vag-Spont Other  LIV    Past Medical History:  Diagnosis Date   Gestational diabetes    History of chlamydia infection 10/16/2017    Past Surgical History:  Procedure Laterality Date   EMBOLIZATION N/A 11/05/2019   Procedure: EMBOLIZATION;  Surgeon: 01/05/2020, MD;  Location: [facility];  Service: Cardiovascular;  Laterality: N/A;   no surgical history      Current Outpatient Medications on File Prior to Visit  Medication Sig Dispense Refill   desogestrel-ethinyl estradiol (MIRCETTE) 0.15-0.02/0.01 MG (21/5) tablet Take 1 tablet by mouth at bedtime. 84 tablet 2   ferrous sulfate 325 (65 FE) MG tablet Take 1 tablet (325 mg total) by mouth 2 (two) times daily with a meal. 60 tablet 1   Prenatal Vit-Fe Fumarate-FA (MULTIVITAMIN-PRENATAL) 27-0.8 MG TABS tablet Take 1 tablet by mouth daily at 12 noon.     No current facility-administered medications on file prior to visit.    No Known Allergies  Social History   Socioeconomic History   Marital status: Single    Spouse name: Not on file   Number of children: Not on file   Years of education: Not on file   Highest education level: Not on file  Occupational  History   Not on file  Tobacco Use   Smoking status: Never   Smokeless tobacco: Never  Vaping Use   Vaping Use: Never used  Substance and Sexual Activity   Alcohol use: No   Drug use: No   Sexual activity: Not Currently  Other Topics Concern   Not on file  Social History Narrative   Not on file   Social Determinants of Health   Financial Resource Strain: Not on file  Food Insecurity: Not on file  Transportation Needs: Not on file  Physical Activity: Not on file  Stress: Not on file  Social Connections: Not on file  Intimate Partner Violence: Not on file    Family History  Problem Relation Age of Onset   Hypertension Mother    Healthy Father    Heart attack Maternal Grandmother    Migraines Maternal Grandmother     The following portions of the patient's history were reviewed and updated as appropriate: allergies, current medications, past family history, past medical history, past social history, past surgical history and problem list.  Review of Systems Review of Systems - Negative except as mentioned in HPI Review of Systems - General ROS: negative for - chills, fatigue, fever, hot flashes, malaise or night sweats Hematological and Lymphatic ROS: negative for -  bleeding problems or swollen lymph nodes Gastrointestinal ROS: negative for - abdominal pain, blood in stools, change in bowel habits and nausea/vomiting Musculoskeletal ROS: negative for - joint pain, muscle pain or muscular weakness Genito-Urinary ROS: negative for - change in menstrual cycle, dysmenorrhea, dyspareunia, dysuria, genital discharge, genital ulcers, hematuria, incontinence, irregular/heavy menses, nocturia or pelvic pain. Positive for increased vaginal discharge.   Objective:   BP 129/84   Pulse (!) 112   Resp 16   Ht 5' (1.524 m)   Wt 164 lb 14.4 oz (74.8 kg)   LMP 09/28/2020 (Exact Date)   BMI 32.20 kg/m  CONSTITUTIONAL: Well-developed, well-nourished female in no acute distress.   HENT:  Normocephalic, atraumatic.  NECK: Normal range of motion, supple, no masses.  Normal thyroid.  SKIN: Skin is warm and dry. No rash noted. Not diaphoretic. No erythema. No pallor. NEUROLGIC: Alert and oriented to person, place, and time. PSYCHIATRIC: Normal mood and affect. Normal behavior. Normal judgment and thought content. CARDIOVASCULAR:Not Examined RESPIRATORY: Not Examined BREASTS: Not Examined ABDOMEN: Soft, non distended; Non tender.  No Organomegaly. PELVIC:  External Genitalia: Normal  BUS: Normal  Vagina: Normal  Cervix: Normal, moderate amount yellow discharge no odor.  MUSCULOSKELETAL: Normal range of motion. No tenderness.  No cyanosis, clubbing, or edema.     Assessment:  Vaginal Discharge    Plan:   Vaginal swab collected , Discussed use of boric acid vaginal suppositories for ph balance. She verbalizes and agrees to plan . Will follow up with results. Pt states she has annual exam scheduled with Dr. Logan Bores for completion of pap smear. Pt request refill for Iron, orders placed. Pt encouraged to keep that appointment as scheduled.   Doreene Burke, CNM

## 2020-10-13 ENCOUNTER — Other Ambulatory Visit: Payer: Self-pay | Admitting: Certified Nurse Midwife

## 2020-10-13 LAB — CERVICOVAGINAL ANCILLARY ONLY
Bacterial Vaginitis (gardnerella): POSITIVE — AB
Candida Glabrata: POSITIVE — AB
Candida Vaginitis: NEGATIVE
Chlamydia: NEGATIVE
Comment: NEGATIVE
Comment: NEGATIVE
Comment: NEGATIVE
Comment: NEGATIVE
Comment: NEGATIVE
Comment: NORMAL
Neisseria Gonorrhea: NEGATIVE
Trichomonas: POSITIVE — AB

## 2020-10-13 MED ORDER — METRONIDAZOLE 0.75 % VA GEL: 1.0000 | Freq: Every day | VAGINAL | 0 refills | Status: AC

## 2020-10-13 MED ORDER — METRONIDAZOLE 500 MG PO TABS: 2000.0000 mg | ORAL_TABLET | Freq: Once | ORAL | 0 refills | Status: AC

## 2020-10-13 MED ORDER — FLUCONAZOLE 150 MG PO TABS: 150.0000 mg | ORAL_TABLET | Freq: Once | ORAL | 0 refills | Status: AC

## 2020-10-13 NOTE — Progress Notes (Signed)
Vaginal swab positive for yeast/BV and Trichomoniasis  Orders placed for treatment. PT notified.   Doreene Burke, CNM

## 2020-11-10 ENCOUNTER — Ambulatory Visit (INDEPENDENT_AMBULATORY_CARE_PROVIDER_SITE_OTHER): Payer: Self-pay | Admitting: Obstetrics and Gynecology

## 2020-11-10 ENCOUNTER — Other Ambulatory Visit (HOSPITAL_COMMUNITY)
Admission: RE | Admit: 2020-11-10 | Discharge: 2020-11-10 | Disposition: A | Discharge status: Home / Self Care | Payer: Self-pay | Source: Ambulatory Visit | Attending: Obstetrics and Gynecology | Admitting: Obstetrics and Gynecology

## 2020-11-10 ENCOUNTER — Other Ambulatory Visit: Payer: Self-pay

## 2020-11-10 ENCOUNTER — Encounter: Payer: Self-pay | Admitting: Obstetrics and Gynecology

## 2020-11-10 VITALS — BP 119/79 | HR 105 | Ht 60.0 in | Wt 163.7 lb

## 2020-11-10 DIAGNOSIS — Z01419 Encounter for gynecological examination (general) (routine) without abnormal findings: Secondary | ICD-10-CM

## 2020-11-10 DIAGNOSIS — Z124 Encounter for screening for malignant neoplasm of cervix: Secondary | ICD-10-CM

## 2020-11-10 MED ORDER — DESOGESTREL-ETHINYL ESTRADIOL 0.15-0.02/0.01 MG (21/5) PO TABS: 1.0000 | ORAL_TABLET | Freq: Every day | ORAL | 3 refills | Status: DC

## 2020-11-10 NOTE — Progress Notes (Signed)
HPI:      [name] is a 20 y.o. G1P1001 who LMP was Patient's last menstrual period was 10/14/2020 (approximate).  Subjective:   She presents today for her annual examination.  She reports that she is doing well.  At this time she is not sexually active but she continues to take OCPs on a regular basis.  She is having normal regular cycles and would like to continue on OCPs. She recently was diagnosed with vaginitis and trichomonas.  She received the appropriate treatment and no longer has any symptoms. She had a baby 1 year ago and her delivery was complicated by significant postpartum hemorrhage requiring Bakri balloon, embolization procedure and multiple blood transfusions.    Hx: The following portions of the patient's history were reviewed and updated as appropriate:             She  has a past medical history of Gestational diabetes and History of chlamydia infection (10/16/2017). She does not have any pertinent problems on file. She  has a past surgical history that includes no surgical history and EMBOLIZATION (N/A, 11/05/2019). Her family history includes Healthy in her father; Heart attack in her maternal grandmother; Hypertension in her mother; Migraines in her maternal grandmother. She  reports that she has never smoked. She has never used smokeless tobacco. She reports that she does not drink alcohol and does not use drugs. She has a current medication list which includes the following prescription(s): ferrous sulfate, multivitamin-prenatal, and desogestrel-ethinyl estradiol. She has No Known Allergies.       Review of Systems:  Review of Systems  Constitutional: Denied constitutional symptoms, night sweats, recent illness, fatigue, fever, insomnia and weight loss.  Eyes: Denied eye symptoms, eye pain, photophobia, vision change and visual disturbance.  Ears/Nose/Throat/Neck: Denied ear, nose, throat or neck symptoms, hearing loss, nasal discharge, sinus congestion and  sore throat.  Cardiovascular: Denied cardiovascular symptoms, arrhythmia, chest pain/pressure, edema, exercise intolerance, orthopnea and palpitations.  Respiratory: Denied pulmonary symptoms, asthma, pleuritic pain, productive sputum, cough, dyspnea and wheezing.  Gastrointestinal: Denied, gastro-esophageal reflux, melena, nausea and vomiting.  Genitourinary: Denied genitourinary symptoms including symptomatic vaginal discharge, pelvic relaxation issues, and urinary complaints.  Musculoskeletal: Denied musculoskeletal symptoms, stiffness, swelling, muscle weakness and myalgia.  Dermatologic: Denied dermatology symptoms, rash and scar.  Neurologic: Denied neurology symptoms, dizziness, headache, neck pain and syncope.  Psychiatric: Denied psychiatric symptoms, anxiety and depression.  Endocrine: Denied endocrine symptoms including hot flashes and night sweats.   Meds:   Current Outpatient Medications on File Prior to Visit  Medication Sig Dispense Refill   ferrous sulfate 325 (65 FE) MG tablet Take 1 tablet (325 mg total) by mouth 2 (two) times daily with a meal. 60 tablet 5   Prenatal Vit-Fe Fumarate-FA (MULTIVITAMIN-PRENATAL) 27-0.8 MG TABS tablet Take 1 tablet by mouth daily at 12 noon.     No current facility-administered medications on file prior to visit.     [facility] - 11/10/20 1101       Pregnancy Intention Screening   Does the patient want to become pregnant in the next year? No    Does the patient's partner want to become pregnant in the next year? No    Would the patient like to discuss contraceptive options today? No      Contraception Wrap Up   Current Method Oral Contraceptive    End Method Oral Contraceptive    Contraception Counseling Provided No  The pregnancy intention screening data noted above was reviewed. Potential methods of contraception were discussed. The patient elected to proceed with Oral Contraceptive.    Objective:     Vitals:    11/10/20 1058  BP: 119/79  Pulse: (!) 105    Filed Weights   11/10/20 1058  Weight: 163 lb 11.2 oz (74.3 kg)              Physical examination General NAD, Conversant  HEENT Atraumatic; Op clear with mmm.  Normo-cephalic. Pupils reactive. Anicteric sclerae  Thyroid/Neck Smooth without nodularity or enlargement. Normal ROM.  Neck Supple.  Skin No rashes, lesions or ulceration. Normal palpated skin turgor. No nodularity.  Breasts: No masses or discharge.  Symmetric.  No axillary adenopathy.  Lungs: Clear to auscultation.No rales or wheezes. Normal Respiratory effort, no retractions.  Heart: NSR.  No murmurs or rubs appreciated. No periferal edema  Abdomen: Soft.  Non-tender.  No masses.  No HSM. No hernia  Extremities: Moves all appropriately.  Normal ROM for age. No lymphadenopathy.  Neuro: Oriented to PPT.  Normal mood. Normal affect.     Pelvic:   Vulva: Normal appearance.  No lesions.  Vagina: No lesions or abnormalities noted.  Support: Normal pelvic support.  Urethra No masses tenderness or scarring.  Meatus Normal size without lesions or prolapse.  Cervix: Normal appearance.  No lesions.  Anus: Normal exam.  No lesions.  Perineum: Normal exam.  No lesions.        Bimanual   Uterus: Top normal size non-tender.  Mobile.  AV.  Adnexae: No masses.  Non-tender to palpation.  Cul-de-sac: Negative for abnormality.     Assessment:    G1P1001 Patient Active Problem List   Diagnosis Date Noted   Anemia 11/26/2019   B12 deficiency 11/26/2019   Acute blood loss anemia 11/08/2019   Normal labor 11/04/2019   PPH (postpartum hemorrhage) 11/04/2019   Abnormal glucose tolerance test (GTT) 09/12/2019   Marginal insertion of umbilical cord affecting management of mother 07/17/2019   Echogenic intracardiac focus of fetus on prenatal ultrasound 07/17/2019   High risk teen pregnancy, antepartum 05/16/2019   Maternal varicella, non-immune 05/16/2019   History of chlamydia infection  10/16/2017     1. Cervical cancer screening   2. Well woman exam        Plan:            1.  Basic Screening Recommendations The basic screening recommendations for asymptomatic women were discussed with the patient during her visit.  The age-appropriate recommendations were discussed with her and the rational for the tests reviewed.  When I am informed by the patient that another primary care physician has previously obtained the age-appropriate tests and they are up-to-date, only outstanding tests are ordered and referrals given as necessary.  Abnormal results of tests will be discussed with her when all of her results are completed.  Routine preventative health maintenance measures emphasized: Exercise/Diet/Weight control, Tobacco Warnings, Alcohol/Substance use risks and Stress Management First Pap performed -GC/CT performed 2.  Patient would like to continue OCPs. Orders No orders of the defined types were placed in this encounter.    Meds ordered this encounter  Medications   desogestrel-ethinyl estradiol (MIRCETTE) 0.15-0.02/0.01 MG (21/5) tablet    Sig: Take 1 tablet by mouth at bedtime.    Dispense:  84 tablet    Refill:  3         F/U  Return in about 1 year (around 11/10/2021) for  Annual Physical.  Elonda Husky, M.D. 11/10/2020 11:30 AM

## 2020-11-11 LAB — CYTOLOGY - PAP
Chlamydia: NEGATIVE
Comment: NEGATIVE
Comment: NORMAL
Diagnosis: NEGATIVE
Neisseria Gonorrhea: NEGATIVE

## 2021-11-03 ENCOUNTER — Other Ambulatory Visit: Payer: Self-pay | Admitting: Obstetrics and Gynecology

## 2021-11-03 DIAGNOSIS — Z01419 Encounter for gynecological examination (general) (routine) without abnormal findings: Secondary | ICD-10-CM

## 2021-11-11 ENCOUNTER — Encounter: Payer: Self-pay | Admitting: Certified Nurse Midwife

## 2021-11-11 ENCOUNTER — Encounter: Payer: Self-pay | Admitting: Obstetrics and Gynecology

## 2021-11-25 ENCOUNTER — Ambulatory Visit: Payer: Medicaid Other | Admitting: Obstetrics and Gynecology

## 2021-11-28 ENCOUNTER — Encounter (INDEPENDENT_AMBULATORY_CARE_PROVIDER_SITE_OTHER): Payer: Self-pay

## 2021-11-30 ENCOUNTER — Ambulatory Visit (INDEPENDENT_AMBULATORY_CARE_PROVIDER_SITE_OTHER): Payer: No Typology Code available for payment source | Admitting: Obstetrics and Gynecology

## 2021-11-30 ENCOUNTER — Other Ambulatory Visit (HOSPITAL_COMMUNITY)
Admission: RE | Admit: 2021-11-30 | Discharge: 2021-11-30 | Disposition: A | Discharge status: Home / Self Care | Payer: No Typology Code available for payment source | Source: Ambulatory Visit | Attending: Obstetrics and Gynecology | Admitting: Obstetrics and Gynecology

## 2021-11-30 ENCOUNTER — Encounter: Payer: Self-pay | Admitting: Obstetrics and Gynecology

## 2021-11-30 DIAGNOSIS — Z113 Encounter for screening for infections with a predominantly sexual mode of transmission: Secondary | ICD-10-CM | POA: Diagnosis not present

## 2021-11-30 DIAGNOSIS — Z01419 Encounter for gynecological examination (general) (routine) without abnormal findings: Secondary | ICD-10-CM

## 2021-11-30 NOTE — Progress Notes (Signed)
HPI:      [name] is a 21 y.o. G1P1001 who LMP was No LMP recorded.  Subjective:   She presents today for her annual examination.  She was previously taking OCPs but felt as if they were making her gain weight.  She discontinued them and she has lost some weight.  She seems to be happy not taking OCPs.  She has normal regular menses off pills.  She is not currently sexually active.  She has no complaints today    Hx: The following portions of the patient's history were reviewed and updated as appropriate:             She  has a past medical history of Gestational diabetes and History of chlamydia infection (10/16/2017). She does not have any pertinent problems on file. She  has a past surgical history that includes no surgical history and EMBOLIZATION (N/A, 11/05/2019). Her family history includes Healthy in her father; Heart attack in her maternal grandmother; Hypertension in her mother; Migraines in her maternal grandmother. She  reports that she has never smoked. She has never used smokeless tobacco. She reports that she does not drink alcohol and does not use drugs. She has a current medication list which includes the following prescription(s): elderberry and ferrous sulfate. She has No Known Allergies.       Review of Systems:  Review of Systems  Constitutional: Denied constitutional symptoms, night sweats, recent illness, fatigue, fever, insomnia and weight loss.  Eyes: Denied eye symptoms, eye pain, photophobia, vision change and visual disturbance.  Ears/Nose/Throat/Neck: Denied ear, nose, throat or neck symptoms, hearing loss, nasal discharge, sinus congestion and sore throat.  Cardiovascular: Denied cardiovascular symptoms, arrhythmia, chest pain/pressure, edema, exercise intolerance, orthopnea and palpitations.  Respiratory: Denied pulmonary symptoms, asthma, pleuritic pain, productive sputum, cough, dyspnea and wheezing.  Gastrointestinal: Denied, gastro-esophageal  reflux, melena, nausea and vomiting.  Genitourinary: Denied genitourinary symptoms including symptomatic vaginal discharge, pelvic relaxation issues, and urinary complaints.  Musculoskeletal: Denied musculoskeletal symptoms, stiffness, swelling, muscle weakness and myalgia.  Dermatologic: Denied dermatology symptoms, rash and scar.  Neurologic: Denied neurology symptoms, dizziness, headache, neck pain and syncope.  Psychiatric: Denied psychiatric symptoms, anxiety and depression.  Endocrine: Denied endocrine symptoms including hot flashes and night sweats.   Meds:   Current Outpatient Medications on File Prior to Visit  Medication Sig Dispense Refill   ELDERBERRY PO Take by mouth.     ferrous sulfate 325 (65 FE) MG tablet Take 1 tablet (325 mg total) by mouth 2 (two) times daily with a meal. 60 tablet 5   No current facility-administered medications on file prior to visit.     Objective:     There were no vitals filed for this visit.  There were no vitals filed for this visit.            Physical examination General NAD, Conversant  HEENT Atraumatic; Op clear with mmm.  Normo-cephalic. Pupils reactive. Anicteric sclerae  Thyroid/Neck Smooth without nodularity or enlargement. Normal ROM.  Neck Supple.  Skin No rashes, lesions or ulceration. Normal palpated skin turgor. No nodularity.  Breasts: No masses or discharge.  Symmetric.  No axillary adenopathy.  Lungs: Clear to auscultation.No rales or wheezes. Normal Respiratory effort, no retractions.  Heart: NSR.  No murmurs or rubs appreciated. No periferal edema  Abdomen: Soft.  Non-tender.  No masses.  No HSM. No hernia  Extremities: Moves all appropriately.  Normal ROM for age. No lymphadenopathy.  Neuro: Oriented to  PPT.  Normal mood. Normal affect.     Pelvic:   Vulva: Normal appearance.  No lesions.  Vagina: No lesions or abnormalities noted.  Support: Normal pelvic support.  Urethra No masses tenderness or scarring.   Meatus Normal size without lesions or prolapse.  Cervix: Normal appearance.  No lesions.  Anus: Normal exam.  No lesions.  Perineum: Normal exam.  No lesions.        Bimanual   Uterus: Normal size.  Non-tender.  Mobile.  AV.  Adnexae: No masses.  Non-tender to palpation.  Cul-de-sac: Negative for abnormality.     Assessment:    G1P1001 Patient Active Problem List   Diagnosis Date Noted   Anemia 11/26/2019   B12 deficiency 11/26/2019   Acute blood loss anemia 11/08/2019   Normal labor 11/04/2019   PPH (postpartum hemorrhage) 11/04/2019   Abnormal glucose tolerance test (GTT) 09/12/2019   Marginal insertion of umbilical cord affecting management of mother 07/17/2019   Echogenic intracardiac focus of fetus on prenatal ultrasound 07/17/2019   High risk teen pregnancy, antepartum 05/16/2019   Maternal varicella, non-immune 05/16/2019   History of chlamydia infection 10/16/2017     1. Well woman exam   2. Screening for STD (sexually transmitted disease)        Plan:            1.  Basic Screening Recommendations The basic screening recommendations for asymptomatic women were discussed with the patient during her visit.  The age-appropriate recommendations were discussed with her and the rational for the tests reviewed.  When I am informed by the patient that another primary care physician has previously obtained the age-appropriate tests and they are up-to-date, only outstanding tests are ordered and referrals given as necessary.  Abnormal results of tests will be discussed with her when all of her results are completed.  Routine preventative health maintenance measures emphasized: Exercise/Diet/Weight control, Tobacco Warnings, Alcohol/Substance use risks and Stress Management 2.  GC/CT performed 3.  We have discussed birth control and patient does not want any birth control at this time.  She is not sexually active.  If she desires birth control she will contact [place]. Orders No  orders of the defined types were placed in this encounter.   No orders of the defined types were placed in this encounter.         F/U  Return in about 1 year (around 12/01/2022) for Annual Physical.  Elonda Husky, M.D. 11/30/2021 9:49 AM

## 2021-11-30 NOTE — Progress Notes (Signed)
Patients presents for annual exam today. She states stopping her OCP's due to weight gain, reports now having regular cycles and weight loss. Patient is up to date on pap smear. Urine GC/CT ordered. She states no other questions or concerns at this time.

## 2021-12-01 LAB — URINE CYTOLOGY ANCILLARY ONLY
Chlamydia: NEGATIVE
Comment: NEGATIVE
Comment: NORMAL
Neisseria Gonorrhea: NEGATIVE

## 2022-06-23 NOTE — Telephone Encounter (Signed)
Signing note, See previous encounter on 02/05/20 

## 2022-11-23 ENCOUNTER — Telehealth: Payer: Self-pay | Admitting: Obstetrics and Gynecology

## 2022-11-23 NOTE — Telephone Encounter (Signed)
Reached out to patient to get her set up for her annual appt vm not set up unable to leave message. Patient is due for annual with Dr Logan Bores after 12/01/22

## 2022-11-28 ENCOUNTER — Other Ambulatory Visit (HOSPITAL_COMMUNITY)
Admission: RE | Admit: 2022-11-28 | Discharge: 2022-11-28 | Disposition: A | Discharge status: Home / Self Care | Payer: No Typology Code available for payment source | Source: Ambulatory Visit | Attending: Obstetrics and Gynecology | Admitting: Obstetrics and Gynecology

## 2022-11-28 ENCOUNTER — Ambulatory Visit (INDEPENDENT_AMBULATORY_CARE_PROVIDER_SITE_OTHER): Payer: No Typology Code available for payment source

## 2022-11-28 VITALS — BP 110/75 | HR 116 | Ht 60.0 in | Wt 155.0 lb

## 2022-11-28 DIAGNOSIS — Z113 Encounter for screening for infections with a predominantly sexual mode of transmission: Secondary | ICD-10-CM | POA: Diagnosis present

## 2022-11-28 DIAGNOSIS — R102 Pelvic and perineal pain: Secondary | ICD-10-CM | POA: Diagnosis not present

## 2022-11-28 DIAGNOSIS — N898 Other specified noninflammatory disorders of vagina: Secondary | ICD-10-CM

## 2022-11-28 LAB — POCT URINALYSIS DIPSTICK
Bilirubin, UA: NEGATIVE
Blood, UA: POSITIVE
Glucose, UA: POSITIVE — AB
Ketones, UA: 2
Nitrite, UA: NEGATIVE
Protein, UA: NEGATIVE
Spec Grav, UA: 1.01 (ref 1.010–1.025)
Urobilinogen, UA: 0.2 U/dL
pH, UA: 5 (ref 5.0–8.0)

## 2022-11-28 MED ORDER — FLUCONAZOLE 150 MG PO TABS: 150.0000 mg | ORAL_TABLET | Freq: Once | ORAL | 0 refills | Status: AC

## 2022-11-28 NOTE — Addendum Note (Signed)
Addended by: Althea Grimmer B on: 11/28/2022 02:47 PM   Modules accepted: Orders

## 2022-11-28 NOTE — Patient Instructions (Addendum)
Trichomoniasis Trichomoniasis is a sexually transmitted infection (STI). Many people with trichomoniasis do not have any symptoms (are asymptomatic) or have only minimal symptoms. Untreated trichomoniasis can last from months to years. This condition is treated with medicine. What are the causes? This condition is caused by a parasite called Trichomonas vaginalis and is transmitted during sexual contact. What increases the risk? The following factors may make you more likely to develop this condition: Having unprotected sex. Having sex with a partner who has trichomoniasis. Having multiple sexual partners. Having had previous trichomoniasis infections or other STIs. What are the signs or symptoms? In females, symptoms of trichomoniasis include: Itching, burning, redness, or soreness in the genital area. Discomfort while urinating. Abnormal vaginal discharge that is clear, white, gray, or yellow-green and foamy and has an unusual fishy odor. In males, symptoms of trichomoniasis include: Discharge from the penis. Burning after urination or ejaculation. Itching or discomfort inside the penis. How is this diagnosed? This condition is diagnosed based on tests. To perform a test, your health care provider will do one of the following: Ask you to provide a urine sample. Take a sample of discharge. The sample may be taken from the vagina or cervix in females and from the urethra in males. Your health care provider may use a swab to collect the sample. Your health care provider may test you for other STIs, including human immunodeficiency virus (HIV). How is this treated?  This condition is treated with medicines such as metronidazole or tinidazole. These are called antimicrobial medicines, and they are taken by mouth (orally). Your sexual partner or partners also need to be tested and treated. If they have the infection and are not treated, you will likely get reinfected. If you plan to become  pregnant or think you may be pregnant, tell your health care provider right away. Some medicines that are used to treat the infection should not be taken during pregnancy. Your health care provider may recommend over-the-counter medicines or creams to help relieve itching or irritation. You may be tested for the infection again 3 months after treatment. Follow these instructions at home: Medicines Take over-the-counter and prescription medicines only as told by your health care provider. Take your antimicrobial medicine as told by your health care provider. Do not stop taking it even if you start to feel better. Use creams as told by your health care provider. General instructions Do not have sex until after you finish your medicine and your symptoms have resolved. Do not wear tampons while you have the infection (if you are female). Talk with your sexual partner or partners about any symptoms that either of you may have, as well as any history of STIs. Keep all follow-up visits. This is important. How is this prevented?  Use condoms every time you have sex. Using condoms correctly and consistently can help protect against STIs. Do not have sexual contact if you have symptoms of trichomoniasis or another STI. Avoid having multiple sexual partners. Get tested for STIs before you have sex with a partner. Ask all partners to do the same. Do not douche (if you are female). Douching may increase your risk for getting STIs due to the removal of good bacteria in the vagina. Contact a health care provider if: You still have symptoms after you finish your medicine. You develop a rash. You plan to become pregnant or think you may be pregnant. Summary Trichomoniasis is a sexually transmitted infection (STI). This condition often has no symptoms or minimal  symptoms. Take your antimicrobial medicine as told by your health care provider. Do not stop taking even if you start to feel better. Discuss your  infection with your sexual partner or partners. Make sure that all partners get tested and treated, if necessary. You should not have sex until after you finish your medicine and your symptoms have resolved. Keep all follow-up visits. This is important. This information is not intended to replace advice given to you by your health care provider. Make sure you discuss any questions you have with your health care provider. Document Revised: 12/15/2020 Document Reviewed: 12/15/2020 Else[employer] Patient Education  2024 [employer] Chlamydia, Female  Chlamydia is a sexually transmitted infection (STI). This infection spreads through sexual contact. Chlamydia can occur in different areas of the body, including: The urethra. This is the part of the body that drains urine from the bladder. The cervix. This is the lowest part of the uterus. The throat. The rectum. This condition is not difficult to treat. However, if left untreated, chlamydia can lead to more serious health problems, including pelvic inflammatory disease (PID). PID can increase your risk of being unable to have children. In pregnant women, untreated chlamydia can cause serious complications during pregnancy or health problems for the baby after delivery. What are the causes? This condition is caused by a bacteria called Chlamydia trachomatis. The bacteria are spread from an infected partner during sexual activity. Chlamydia can spread through contact with the genitals, mouth, or rectum. What increases the risk? The following factors may make you more likely to develop this condition: Not using a condom the right way or not using a condom every time you have sex. Having a new sex partner or having more than one sex partner. Being sexually active before age 26. What are the signs or symptoms? In some cases, there are no symptoms, especially early in the infection. If symptoms develop, they may include: Urinating often, or a burning  feeling during urination. Redness, soreness, or swelling of the vagina or rectum. Discharge coming from the vagina or rectum. Pain in the abdomen. Pain during sex. Bleeding between menstrual periods or irregular periods. How is this diagnosed? This condition may be diagnosed with: Urine tests. Swab tests. Depending on your symptoms, your health care provider may use a cotton swab to collect a fluid sample from your vagina, rectum, nose, or throat to test for the bacteria. A pelvic exam. How is this treated? This condition is treated with antibiotic medicines. Follow these instructions at home: Sexual activity Tell your sex partner or partners about your infection. These include any partners for oral, anal, or vaginal sex that you have had within 60 days of when your symptoms started. Sex partners should also be treated, even if they have no signs of the infection. Do not have sex until you and your sex partners have completed treatment and your health care provider says it is okay. If your health care provider prescribed you a single-dose medicine as treatment, wait at least 7 days after taking the medicine before having sex. General instructions Take over-the-counter and prescription medicines as told by your health care provider. Finish all antibiotic medicine even when you start to feel better. It is up to you to get your test results. Ask your health care provider, or the department that is doing the test, when your results will be ready. Keep all follow-up visits. This is important. You may need to be tested for infection again 3 months after treatment. How  is this prevented? You can lower your risk of getting chlamydia by: Using latex or polyurethane condoms correctly every time you have sex. Not having multiple sex partners. Asking if your sex partner has been tested for STIs and had negative results. Getting regular health screenings to check for STIs. Contact a health care  provider if: You develop new symptoms or your symptoms are getting worse. Your symptoms do not get better after treatment. You have a fever or chills. You have pain during sex. You have irregular menstrual periods, or you have bleeding between periods or after sex. You develop flu-like symptoms, such as night sweats, sore throat, or muscle aches. You are unable to take your antibiotic medicine as prescribed. Summary Chlamydia is a sexually transmitted infection (STI) that is caused by bacteria. This infection spreads through sexual contact. This condition is treated with antibiotic medicines. If left untreated, chlamydia can lead to more serious health problems, including pelvic inflammatory disease (PID). Your sex partners will also need to be treated. Do not have sex until both you and your partner have been treated. Take medicines as directed by your health care provider and keep all follow-up visits to ensure your infection has been completely treated. This information is not intended to replace advice given to you by your health care provider. Make sure you discuss any questions you have with your health care provider. Document Revised: 11/08/2020 Document Reviewed: 11/08/2020 Else[employer] Patient Education  2024 [employer] Gonorrhea Gonorrhea is a sexually transmitted infection (STI) that can infect any person. If left untreated, this infection can: Damage the reproductive organs. Spread to other parts of the body. Cause someone to be unable to have children (infertility). Harm an unborn baby if an infected person is pregnant. It is important to get treatment for gonorrhea as soon as possible. All of your sex partners may also need to be treated for the infection. What are the causes? This condition is caused by bacteria called Neisseria gonorrhoeae. The infection is spread from person to person through sexual contact, including oral, anal, and vaginal sex. The infection can also pass  from a pregnant person to the baby during birth. What increases the risk? The following factors may make you more likely to develop this condition: Being a woman younger than 25 years and sexually active. Being a man who has sex with men. Having a new sex partner or having multiple partners. Having a sex partner who has an STI. Not using condoms correctly or not using condoms every time you have sex. Having a history of STIs. What are the signs or symptoms? When symptoms occur, they may include: Abnormal discharge from the penis or vagina. The discharge may be cloudy, thick, or yellow-green in color. Pain or burning when you urinate. Itching, irritation, pain, bleeding, or discharge from the rectum. This may occur if the infection was spread by anal sex. Sore throat or swollen lymph nodes in the neck. This may occur if the infection was spread by oral sex. Pain or swelling in the testicles. Bleeding between menstrual periods. If the infection has spread to other areas of the body, symptoms may include: Fever. Eye irritation. Swelling, redness, warmth, and pain in the joints. Rashes. In some cases, there are no symptoms. How is this diagnosed? This condition is diagnosed based on: A physical exam. A swab of fluid. The swab of fluid may be taken from the penis, vagina, throat, or rectum. Urine tests. Not all test results will be available during your  visit. How is this treated? This condition is treated with antibiotic medicines. It is important to start treatment as soon as possible. Early treatment may prevent some problems from developing. You should not have sex during treatment. All types of sexual activity should be avoided for at least 7 days after treatment is complete and until your sex partner or partners have been treated. Follow these instructions at home: Take over-the-counter and prescription medicines as told by your health care provider. Finish all antibiotic medicine  even when you start to feel better. Do not have sex during treatment. Do not have sex until at least 7 days after you and your partner or partners have finished treatment and your health care provider says it is okay. It is up to you to get your test results. Ask your health care provider, or the department that is doing the test, when your results will be ready. If you get a positive result on your gonorrhea test, tell your recent sex partners. These include any partners for oral, anal, or vaginal sex. They need to be checked for gonorrhea even if they do not have symptoms. They may need treatment, even if they get negative results on their gonorrhea tests. Keep all follow-up visits. This is important. How is this prevented?  Use latex or polyurethane condoms correctly every time you have sex. Ask if your sex partner or partners have been tested for STIs and had negative results. Avoid having multiple sex partners. Get regular health screenings to check for STIs. Contact a health care provider if: Your symptoms do not get better after a few days of taking antibiotics. Your symptoms get worse. You cannot take your medicine as directed by your health care provider. You develop new symptoms, including: Eye irritation. Swelling, redness, warmth, and pain in the joints. Rashes. You have a fever. Summary Gonorrhea is a sexually transmitted infection (STI) that can infect any person. This infection is spread from person to person through sexual contact, including oral, anal, and vaginal sex. The infection can also pass from a pregnant person to the baby during birth. Symptoms include abnormal discharge, pain or burning while urinating, or pain in the rectum. This condition is treated with antibiotic medicines. Do not have sex until at least 7 days after both you and any sex partners have completed antibiotic treatment. Tell your health care provider if you have trouble taking your medicine, your  symptoms get worse, or you have new symptoms. Keep all follow-up visits. This information is not intended to replace advice given to you by your health care provider. Make sure you discuss any questions you have with your health care provider. Document Revised: 12/09/2020 Document Reviewed: 12/09/2020 Else[employer] Patient Education  2024 [employer] Urinary Tract Infection, Adult A urinary tract infection (UTI) is an infection of any part of the urinary tract. The urinary tract includes: The kidneys. The ureters. The bladder. The urethra. These organs make, store, and get rid of pee (urine) in the body. What are the causes? This infection is caused by germs (bacteria) in your genital area. These germs grow and cause swelling (inflammation) of your urinary tract. What increases the risk? The following factors may make you more likely to develop this condition: Using a small, thin tube (catheter) to drain pee. Not being able to control when you pee or poop (incontinence). Being female. If you are female, these things can increase the risk: Using these methods to prevent pregnancy: A medicine that kills sperm (spermicide). A  device that blocks sperm (diaphragm). Having low levels of a female hormone (estrogen). Being pregnant. You are more likely to develop this condition if: You have genes that add to your risk. You are sexually active. You take antibiotic medicines. You have trouble peeing because of: A prostate that is bigger than normal, if you are female. A blockage in the part of your body that drains pee from the bladder. A kidney stone. A nerve condition that affects your bladder. Not getting enough to drink. Not peeing often enough. You have other conditions, such as: Diabetes. A weak disease-fighting system (immune system). Sickle cell disease. Gout. Injury of the spine. What are the signs or symptoms? Symptoms of this condition include: Needing to pee right  away. Peeing small amounts often. Pain or burning when peeing. Blood in the pee. Pee that smells bad or not like normal. Trouble peeing. Pee that is cloudy. Fluid coming from the vagina, if you are female. Pain in the belly or lower back. Other symptoms include: Vomiting. Not feeling hungry. Feeling mixed up (confused). This may be the first symptom in older adults. Being tired and grouchy (irritable). A fever. Watery poop (diarrhea). How is this treated? Taking antibiotic medicine. Taking other medicines. Drinking enough water. In some cases, you may need to see a specialist. Follow these instructions at home:  Medicines Take over-the-counter and prescription medicines only as told by your doctor. If you were prescribed an antibiotic medicine, take it as told by your doctor. Do not stop taking it even if you start to feel better. General instructions Make sure you: Pee until your bladder is empty. Do not hold pee for a long time. Empty your bladder after sex. Wipe from front to back after peeing or pooping if you are a female. Use each tissue one time when you wipe. Drink enough fluid to keep your pee pale yellow. Keep all follow-up visits. Contact a doctor if: You do not get better after 1-2 days. Your symptoms go away and then come back. Get help right away if: You have very bad back pain. You have very bad pain in your lower belly. You have a fever. You have chills. You feeling like you will vomit or you vomit. Summary A urinary tract infection (UTI) is an infection of any part of the urinary tract. This condition is caused by germs in your genital area. There are many risk factors for a UTI. Treatment includes antibiotic medicines. Drink enough fluid to keep your pee pale yellow. This information is not intended to replace advice given to you by your health care provider. Make sure you discuss any questions you have with your health care provider. Document  Revised: 08/24/2019 Document Reviewed: 08/29/2019 Else[employer] Patient Education  2024 [employer] Vaginal Yeast Infection, Adult  Vaginal yeast infection is a condition that causes vaginal discharge as well as soreness, swelling, and redness (inflammation) of the vagina. This is a common condition. Some women get this infection frequently. What are the causes? This condition is caused by a change in the normal balance of the yeast (Candida) and normal bacteria that live in the vagina. This change causes an overgrowth of yeast, which causes the inflammation. What increases the risk? The condition is more likely to develop in women who: Take antibiotic medicines. Have diabetes. Take birth control pills. Are pregnant. Douche often. Have a weak body defense system (immune system). Have been taking steroid medicines for a long time. Frequently wear tight clothing. What are the signs  or symptoms? Symptoms of this condition include: White, thick, creamy vaginal discharge. Swelling, itching, redness, and irritation of the vagina. The lips of the vagina (labia) may be affected as well. Pain or a burning feeling while urinating. Pain during sex. How is this diagnosed? This condition is diagnosed based on: Your medical history. A physical exam. A pelvic exam. Your health care provider will examine a sample of your vaginal discharge under a microscope. Your health care provider may send this sample for testing to confirm the diagnosis. How is this treated? This condition is treated with medicine. Medicines may be over-the-counter or prescription. You may be told to use one or more of the following: Medicine that is taken by mouth (orally). Medicine that is applied as a cream (topically). Medicine that is inserted directly into the vagina (suppository). Follow these instructions at home: Take or apply over-the-counter and prescription medicines only as told by your health care provider. Do not  use tampons until your health care provider approves. Do not have sex until your infection has cleared. Sex can prolong or worsen your symptoms of infection. Ask your health care provider when it is safe to resume sexual activity. Keep all follow-up visits. This is important. How is this prevented?  Do not wear tight clothes, such as pantyhose or tight pants. Wear breathable cotton underwear. Do not use douches, perfumed soap, creams, or powders. Wipe from front to back after using the toilet. If you have diabetes, keep your blood sugar levels under control. Ask your health care provider for other ways to prevent yeast infections. Contact a health care provider if: You have a fever. Your symptoms go away and then return. Your symptoms do not get better with treatment. Your symptoms get worse. You have new symptoms. You develop blisters in or around your vagina. You have blood coming from your vagina and it is not your menstrual period. You develop pain in your abdomen. Summary Vaginal yeast infection is a condition that causes discharge as well as soreness, swelling, and redness (inflammation) of the vagina. This condition is treated with medicine. Medicines may be over-the-counter or prescription. Take or apply over-the-counter and prescription medicines only as told by your health care provider. Do not douche. Resume sexual activity or use of tampons as instructed by your health care provider. Contact a health care provider if your symptoms do not get better with treatment or your symptoms go away and then return. This information is not intended to replace advice given to you by your health care provider. Make sure you discuss any questions you have with your health care provider. Document Revised: 04/05/2020 Document Reviewed: 04/05/2020 Else[employer] Patient Education  2024 [employer] Bacterial Vaginosis  Bacterial vaginosis is an infection of the vagina. It happens when too many  normal germs (healthy bacteria) grow in the vagina. This infection can make it easier to get other infections from sex (STIs). It is very important for pregnant women to get treated. This infection can cause babies to be born early or at a low birth weight. What are the causes? This infection is caused by an increase in certain germs that grow in the vagina. You cannot get this infection from toilet seats, bedsheets, swimming pools, or things that touch your vagina. What increases the risk? Having sex with a new person or more than one person. Having sex without protection. Douching. Having an intrauterine device (IUD). Smoking. Using drugs or drinking alcohol. These can lead you to do things that are  risky. Taking certain antibiotic medicines. Being pregnant. What are the signs or symptoms? Some women have no symptoms. Symptoms may include: A discharge from your vagina. It may be gray or white. It can be watery or foamy. A fishy smell. This can happen after sex or during your menstrual period. Itching in and around your vagina. A feeling of burning or pain when you pee (urinate). How is this treated? This infection is treated with antibiotic medicines. These may be given to you as: A pill. A cream for your vagina. A medicine that you put into your vagina (suppository). If the infection comes back after treatment, you may need more antibiotics. Follow these instructions at home: Medicines Take over-the-counter and prescription medicines as told by your doctor. Take or use your antibiotic medicine as told by your doctor. Do not stop taking or using it, even if you start to feel better. General instructions If the person you have sex with is a woman, tell her that you have this infection. She will need to follow up with her doctor. If you have a female partner, he does not need to be treated. Do not have sex until you finish treatment. Drink enough fluid to keep your pee pale  yellow. Keep your vagina and butt clean. Wash the area with warm water each day. Wipe from front to back after you use the toilet. If you are breastfeeding a baby, ask your doctor if you should keep doing so during treatment. Keep all follow-up visits. How is this prevented? Self-care Do not douche. Use only warm water to wash around your vagina. Wear underwear that is cotton or lined with cotton. Do not wear tight pants and pantyhose, especially in the summer. Safe sex Use protection when you have sex. This includes: Use condoms. Use dental dams. This is a thin layer that protects the mouth during oral sex. Limit how many people you have sex with. To prevent this infection, it is best to have sex with just one person. Get tested for STIs. The person you have sex with should also get tested. Drugs and alcohol Do not smoke or use any products that contain nicotine or tobacco. If you need help quitting, ask your doctor. Do not use drugs. Do not drink alcohol if: Your doctor tells you not to drink. You are pregnant, may be pregnant, or are planning to become pregnant. If you drink alcohol: Limit how much you have to 0-1 drink a day. Know how much alcohol is in your drink. In the [place] one drink equals one 12 oz bottle of beer (355 mL), one 5 oz glass of wine (148 mL), or one 1 oz glass of hard liquor (44 mL). Where to find more information Centers for Disease Control and Prevention: FootballExhibition.com.br American Sexual Health Association: www.ashastd.org Office on [employer] Health: http://hoffman.com/ Contact a doctor if: Your symptoms do not get better, even after you are treated. You have more discharge or pain when you pee. You have a fever or chills. You have pain in your belly (abdomen) or in the area between your hips. You have pain with sex. You bleed from your vagina between menstrual periods. Summary This infection can happen when too many germs (bacteria) grow in the  vagina. This infection can make it easier to get infections from sex (STIs). Treating this can lower that chance. Get treated if you are pregnant. This infection can cause babies to be born early. Do not stop taking or using your antibiotic medicine, even  if you start to feel better. This information is not intended to replace advice given to you by your health care provider. Make sure you discuss any questions you have with your health care provider. Document Revised: 07/17/2019 Document Reviewed: 07/17/2019 Elsevier Patient Education  2024 [employer].

## 2022-11-28 NOTE — Progress Notes (Addendum)
    NURSE VISIT NOTE  Subjective:    Patient ID: Kathleen Barnes, female    DOB: 05/24/2000, 22 y.o.   MRN: [id number]  HPI  Patient is a 22 y.o. G75P1001 female who presents for a self swab nurse visit. Patient has been having vaginal greenish discharge, some vaginal itching and irritation. Denies vaginal odor, abnormal vaginal bleeding or significant pelvic pain or fever. Has also noticed some discomfort in pelvic area while urinating, denies frequency and burning urinating. Patient has history of known exposure to STD.   Objective:    BP 110/75   Pulse (!) 116   Ht 5' (1.524 m)   Wt 155 lb (70.3 kg)   LMP 10/22/2022 (Approximate)   BMI 30.27 kg/m     Assessment:   1. Screening for STD (sexually transmitted disease)   2. Vaginal discharge   3. Vaginal itching   4. Pelvic pain      Plan:   Aptima and urine culture sent to lab. Treatment: Diflucan sent. ROV prn if symptoms persist or worsen.   Donnetta Hail, CMA

## 2022-11-28 NOTE — Progress Notes (Signed)
Pls let pt know diflucan eRxd. Any vaginal bleeding/spotting now (like from period) since blood in UA?

## 2022-11-30 LAB — URINE CULTURE

## 2022-11-30 MED ORDER — AMOXICILLIN 500 MG PO CAPS: 500.0000 mg | ORAL_CAPSULE | Freq: Two times a day (BID) | ORAL | 0 refills | Status: AC

## 2022-11-30 NOTE — Addendum Note (Signed)
Addended by: Althea Grimmer B on: 11/30/2022 10:21 AM   Modules accepted: Orders

## 2022-12-01 LAB — CERVICOVAGINAL ANCILLARY ONLY
Bacterial Vaginitis (gardnerella): NEGATIVE
Candida Glabrata: NEGATIVE
Candida Vaginitis: POSITIVE — AB
Chlamydia: NEGATIVE
Comment: NEGATIVE
Comment: NEGATIVE
Comment: NEGATIVE
Comment: NEGATIVE
Comment: NEGATIVE
Comment: NORMAL
Neisseria Gonorrhea: NEGATIVE
Trichomonas: NEGATIVE

## 2022-12-07 ENCOUNTER — Encounter: Payer: Self-pay | Admitting: Obstetrics and Gynecology

## 2022-12-07 ENCOUNTER — Other Ambulatory Visit (HOSPITAL_COMMUNITY)
Admission: RE | Admit: 2022-12-07 | Discharge: 2022-12-07 | Disposition: A | Discharge status: Home / Self Care | Payer: No Typology Code available for payment source | Source: Ambulatory Visit | Attending: Obstetrics and Gynecology | Admitting: Obstetrics and Gynecology

## 2022-12-07 ENCOUNTER — Ambulatory Visit: Payer: No Typology Code available for payment source | Admitting: Obstetrics and Gynecology

## 2022-12-07 VITALS — BP 128/89 | HR 96 | Ht 60.0 in | Wt 153.4 lb

## 2022-12-07 DIAGNOSIS — Z01419 Encounter for gynecological examination (general) (routine) without abnormal findings: Secondary | ICD-10-CM | POA: Insufficient documentation

## 2022-12-07 DIAGNOSIS — N898 Other specified noninflammatory disorders of vagina: Secondary | ICD-10-CM | POA: Insufficient documentation

## 2022-12-07 DIAGNOSIS — Z131 Encounter for screening for diabetes mellitus: Secondary | ICD-10-CM

## 2022-12-07 NOTE — Progress Notes (Signed)
HPI:      [name] is a 22 y.o. G1P1001 who LMP was Patient's last menstrual period was 11/29/2022 (approximate).  Subjective:   She presents today for her annual examination.  She has no complaints.  She does not desire birth control.  She is considering pregnancy in the next year. She is currently being treated for a possible UTI with beta-hemolytic strep.  She is taking amoxicillin. She has recently been tested for STDs and found to be negative.  She complains of a continued vaginal discharge and is requesting culture be performed for BV and yeast only.    Hx: The following portions of the patient's history were reviewed and updated as appropriate:             She  has a past medical history of Gestational diabetes and History of chlamydia infection (10/16/2017). She does not have any pertinent problems on file. She  has a past surgical history that includes EMBOLIZATION (N/A, 11/05/2019). Her family history includes Healthy in her father; Heart attack in her maternal grandmother; Hypertension in her mother; Migraines in her maternal grandmother. She  reports that she has never smoked. She has never used smokeless tobacco. She reports current alcohol use. She reports that she does not use drugs. She has a current medication list which includes the following prescription(s): amoxicillin. She has No Known Allergies.       Review of Systems:  Review of Systems  Constitutional: Denied constitutional symptoms, night sweats, recent illness, fatigue, fever, insomnia and weight loss.  Eyes: Denied eye symptoms, eye pain, photophobia, vision change and visual disturbance.  Ears/Nose/Throat/Neck: Denied ear, nose, throat or neck symptoms, hearing loss, nasal discharge, sinus congestion and sore throat.  Cardiovascular: Denied cardiovascular symptoms, arrhythmia, chest pain/pressure, edema, exercise intolerance, orthopnea and palpitations.  Respiratory: Denied pulmonary symptoms, asthma,  pleuritic pain, productive sputum, cough, dyspnea and wheezing.  Gastrointestinal: Denied, gastro-esophageal reflux, melena, nausea and vomiting.  Genitourinary: See HPI for additional information.  Musculoskeletal: Denied musculoskeletal symptoms, stiffness, swelling, muscle weakness and myalgia.  Dermatologic: Denied dermatology symptoms, rash and scar.  Neurologic: Denied neurology symptoms, dizziness, headache, neck pain and syncope.  Psychiatric: Denied psychiatric symptoms, anxiety and depression.  Endocrine: Denied endocrine symptoms including hot flashes and night sweats.   Meds:   Current Outpatient Medications on File Prior to Visit  Medication Sig Dispense Refill   amoxicillin (AMOXIL) 500 MG capsule Take 1 capsule (500 mg total) by mouth 2 (two) times daily for 7 days. 14 capsule 0   No current facility-administered medications on file prior to visit.     Objective:     Vitals:   12/07/22 0938  BP: 128/89  Pulse: 96    Filed Weights   12/07/22 0938  Weight: 153 lb 6.4 oz (69.6 kg)              Physical examination General NAD, Conversant  HEENT Atraumatic; Op clear with mmm.  Normo-cephalic.  Anicteric sclerae  Thyroid/Neck Smooth without nodularity or enlargement. Normal ROM.  Neck Supple.  Skin No rashes, lesions or ulceration. Normal palpated skin turgor. No nodularity.  Breasts: No masses or discharge.  Symmetric.  No axillary adenopathy.  Lungs: Clear to auscultation.No rales or wheezes. Normal Respiratory effort, no retractions.  Heart: NSR.  No murmurs or rubs appreciated. No peripheral edema  Abdomen: Soft.  Non-tender.  No masses.  No HSM. No hernia  Extremities: Moves all appropriately.  Normal ROM for age. No lymphadenopathy.  Neuro: Oriented to PPT.  Normal mood. Normal affect.     Pelvic:   Vulva: Normal appearance.  No lesions.  Vagina: No lesions or abnormalities noted.  Support: Normal pelvic support.  Urethra No masses tenderness or  scarring.  Meatus Normal size without lesions or prolapse.  Cervix: Normal appearance.  No lesions.  Anus: Normal exam.  No lesions.  Perineum: Normal exam.  No lesions.     Assessment:    G1P1001 Patient Active Problem List   Diagnosis Date Noted   Anemia 11/26/2019   B12 deficiency 11/26/2019   Acute blood loss anemia 11/08/2019   Normal labor 11/04/2019   PPH (postpartum hemorrhage) 11/04/2019   Abnormal glucose tolerance test (GTT) 09/12/2019   Marginal insertion of umbilical cord affecting management of mother 07/17/2019   Echogenic intracardiac focus of fetus on prenatal ultrasound 07/17/2019   High risk teen pregnancy, antepartum 05/16/2019   Maternal varicella, non-immune 05/16/2019   History of chlamydia infection 10/16/2017     1. Well woman exam with routine gynecological exam   2. Screening for diabetes mellitus   3. Vagina itching   4. Vaginal discharge        Plan:            1.  Basic Screening Recommendations The basic screening recommendations for asymptomatic women were discussed with the patient during her visit.  The age-appropriate recommendations were discussed with her and the rational for the tests reviewed.  When I am informed by the patient that another primary care physician has previously obtained the age-appropriate tests and they are up-to-date, only outstanding tests are ordered and referrals given as necessary.  Abnormal results of tests will be discussed with her when all of her results are completed.  Routine preventative health maintenance measures emphasized: Exercise/Diet/Weight control, Tobacco Warnings, Alcohol/Substance use risks and Stress Management Nuswab performed at patient request for BV and yeast 2.  Patient to contact [place] if she changes her mind regarding birth control.  Orders Orders Placed This Encounter  Procedures   Hemoglobin A1c    No orders of the defined types were placed in this encounter.         F/U  Return  in about 1 year (around 12/07/2023) for Annual Physical.  Elonda Husky, M.D. 12/07/2022 10:00 AM

## 2022-12-07 NOTE — Progress Notes (Signed)
Patients presents for annual exam today. She states still having a grey like vaginal discharge and itching, currently being treated for a UTI. Up to date on pap smear. She states no other questions or concerns at this time.

## 2022-12-08 ENCOUNTER — Other Ambulatory Visit: Payer: Self-pay

## 2022-12-08 ENCOUNTER — Ambulatory Visit: Payer: Self-pay

## 2022-12-08 DIAGNOSIS — R7309 Other abnormal glucose: Secondary | ICD-10-CM

## 2022-12-08 DIAGNOSIS — B379 Candidiasis, unspecified: Secondary | ICD-10-CM

## 2022-12-08 LAB — CERVICOVAGINAL ANCILLARY ONLY
Bacterial Vaginitis (gardnerella): NEGATIVE
Candida Glabrata: NEGATIVE
Candida Vaginitis: POSITIVE — AB
Comment: NEGATIVE
Comment: NEGATIVE
Comment: NEGATIVE

## 2022-12-08 LAB — HEMOGLOBIN A1C
Est. average glucose Bld gHb Est-mCnc: 298 mg/dL
Hgb A1c MFr Bld: 12 % — ABNORMAL HIGH (ref 4.8–5.6)

## 2022-12-08 MED ORDER — FLUCONAZOLE 150 MG PO TABS: 150.0000 mg | ORAL_TABLET | Freq: Every day | ORAL | 0 refills | Status: DC

## 2022-12-08 NOTE — Telephone Encounter (Signed)
  Chief Complaint: High A1C Symptoms: Had had some weakness Frequency: ongoing Pertinent Negatives: Patient denies current s/s Disposition: [] ED /[] Urgent Care (no appt availability in office) / [] Appointment(In office/virtual)/ []  [place] [facility]/ [] Home Care/ [] Refused Recommended Disposition /[]  Mobile Bus/ [x]  Follow-up with PCP Additional Notes: Pt called today regarding recent A1C test at her OB/gyn office. A1C was 12.0. Pt wanted advice concerning how to control blood sugar while waiting for upcoming NP appt. Pt was gestational diabetic. Discussed diet and exercise, and s/s of hyperglycemia. Pt may purchase a glucometer at pharmacy. Advised going on ADA website for additional advice.Pt will monitor s/s and call back or seek immediate care if needed.     Summary: sugar elevated concerns   Pt is requesting to speak with a nurse for clinical advice. Stated she was told her sugar is very elevated per recent lab results.    Scheduled pt a new pt appointment Thursday December 14, 2022, 9:50 AM EST at [facility] at [facility]  Please advise.     Answer Assessment - Initial Assessment Questions 1. BLOOD GLUCOSE: "What is your blood glucose level?"      Unsure - recent A1C is 12.0 2. ONSET: "When did you check the blood glucose?"     Blood test yesterday 3. USUAL RANGE: "What is your glucose level usually?" (e.g., usual fasting morning value, usual evening value)     unknown 8. OTHER SYMPTOMS: "Do you have any symptoms?" (e.g., fever, frequent urination, difficulty breathing, dizziness, weakness, vomiting)     weakness  Protocols used: Diabetes - High Blood Sugar-A-AH

## 2022-12-14 ENCOUNTER — Encounter (HOSPITAL_BASED_OUTPATIENT_CLINIC_OR_DEPARTMENT_OTHER): Payer: Self-pay | Admitting: Family Medicine

## 2022-12-14 ENCOUNTER — Ambulatory Visit (INDEPENDENT_AMBULATORY_CARE_PROVIDER_SITE_OTHER): Payer: No Typology Code available for payment source | Admitting: Family Medicine

## 2022-12-14 VITALS — BP 110/91 | HR 95 | Temp 98.6°F | Ht 60.0 in | Wt 154.6 lb

## 2022-12-14 DIAGNOSIS — E1165 Type 2 diabetes mellitus with hyperglycemia: Secondary | ICD-10-CM | POA: Diagnosis not present

## 2022-12-14 DIAGNOSIS — E559 Vitamin D deficiency, unspecified: Secondary | ICD-10-CM | POA: Diagnosis not present

## 2022-12-14 DIAGNOSIS — D649 Anemia, unspecified: Secondary | ICD-10-CM

## 2022-12-14 LAB — GLUCOSE, POCT (MANUAL RESULT ENTRY): POC Glucose: 205 mg/dL — AB (ref 70–99)

## 2022-12-14 MED ORDER — LANCET DEVICE MISC: 1.0000 | Freq: Three times a day (TID) | 3 refills | Status: AC

## 2022-12-14 MED ORDER — LANCETS MISC. MISC: 1.0000 | Freq: Three times a day (TID) | 0 refills | Status: AC

## 2022-12-14 MED ORDER — BLOOD GLUCOSE MONITORING SUPPL DEVI: 1.0000 | Freq: Three times a day (TID) | 0 refills | Status: AC

## 2022-12-14 MED ORDER — METFORMIN HCL ER 500 MG PO TB24: 500.0000 mg | ORAL_TABLET | Freq: Two times a day (BID) | ORAL | 3 refills | Status: DC

## 2022-12-14 MED ORDER — BLOOD GLUCOSE TEST VI STRP: 1.0000 | ORAL_STRIP | Freq: Three times a day (TID) | 3 refills | Status: DC

## 2022-12-14 NOTE — Progress Notes (Signed)
New Patient Office Visit  Subjective:   Kathleen Barnes 19-May-2000 12/14/2022  Chief Complaint  Patient presents with   Establish Care    HPI: Ladina S Slovacek presents today to establish care at [facility] Medicine at [facility]. Introduced to Publishing rights manager role and practice setting.  All questions answered.   Last PCP: [facility] with Dr. Brennan Bailey Last annual physical: 12/07/22- Well Woman Exam with OBGYN Concerns: See below   Patient was recommended by OBGYN to establish care with PCP after her well woman exam revealed A1C of 12.0. Patient reports previous gestational diabetes in pregnancy but delivered prior to starting medication or insuling therapy. Her last glucose check in 2021 was normal per patient and chart review. A1C was not rechecked Current diabetes medication regimen: None Patient is not adhering to a diabetic diet. She has noticed mild 10 lbs weight loss in the past year unintentionally.  Patient is not exercising regularly.  Patient is not checking BS regularly.  Patient is checking their feet regularly.   She states she did have symptoms of high sugar last week with blurred vision, nausea, polydipsia. Denies polyphagia, polyuria. Hx of Type 2 DM on paternal side.   She is starting to take Boost Glucose Control daily as part of diet.  Denies polyphagia, polyuria, open wounds or ulcers on feet.   Lab Results  Component Value Date   POCGLU 205 (A) 12/14/2022    Lab Results  Component Value Date   HGBA1C 12.0 (H) 12/07/2022    12/14/2022- Foot Exam Completed in office   Lab Results  Component Value Date   LABMICR Comment 04/24/2019   Wt Readings from Last 3 Encounters:  12/14/22 154 lb 9.6 oz (70.1 kg)  12/07/22 153 lb 6.4 oz (69.6 kg)  11/28/22 155 lb (70.3 kg)    The following portions of the patient's history were reviewed and updated as appropriate: past medical history, past surgical  history, family history, social history, allergies, medications, and problem list.   Patient Active Problem List   Diagnosis Date Noted   Anemia 11/26/2019   B12 deficiency 11/26/2019   Acute blood loss anemia 11/08/2019   Normal labor 11/04/2019   PPH (postpartum hemorrhage) 11/04/2019   Abnormal glucose tolerance test (GTT) 09/12/2019   Marginal insertion of umbilical cord affecting management of mother 07/17/2019   Echogenic intracardiac focus of fetus on prenatal ultrasound 07/17/2019   High risk teen pregnancy, antepartum 05/16/2019   Maternal varicella, non-immune 05/16/2019   History of chlamydia infection 10/16/2017   Past Medical History:  Diagnosis Date   Gestational diabetes    History of chlamydia infection 10/16/2017   Past Surgical History:  Procedure Laterality Date   EMBOLIZATION (CATH LAB) N/A 11/05/2019   Procedure: EMBOLIZATION;  Surgeon: Annice Needy, MD;  Location: [facility] INVASIVE CV LAB;  Service: Cardiovascular;  Laterality: N/A;   Family History  Problem Relation Age of Onset   Hypertension Mother    Healthy Father    Heart attack Maternal Grandmother    Migraines Maternal Grandmother    Breast cancer Neg Hx    Ovarian cancer Neg Hx    Colon cancer Neg Hx    Social History   Socioeconomic History   Marital status: Single    Spouse name: Not on file   Number of children: Not on file   Years of education: Not on file   Highest education level: Not on file  Occupational  History   Not on file  Tobacco Use   Smoking status: Never   Smokeless tobacco: Never  Vaping Use   Vaping status: Every Day   Substances: Nicotine  Substance and Sexual Activity   Alcohol use: Yes    Comment: soc   Drug use: No   Sexual activity: Yes    Birth control/protection: None  Other Topics Concern   Not on file  Social History Narrative   Not on file   Social Determinants of Health   Financial Resource Strain: Not on file  Food Insecurity: Not on file   Transportation Needs: Not on file  Physical Activity: Not on file  Stress: Not on file  Social Connections: Not on file  Intimate Partner Violence: Not on file   Outpatient Medications Prior to Visit  Medication Sig Dispense Refill   fluconazole (DIFLUCAN) 150 MG tablet Take 1 tablet (150 mg total) by mouth daily. (Patient not taking: Reported on 12/14/2022) 1 tablet 0   No facility-administered medications prior to visit.   No Known Allergies  ROS: A complete ROS was performed with pertinent positives/negatives noted in the HPI. The remainder of the ROS are negative.   Objective:   Today's Vitals   12/14/22 0958  BP: (!) 110/91  Pulse: 95  Temp: 98.6 F (37 C)  TempSrc: Oral  SpO2: 99%  Weight: 154 lb 9.6 oz (70.1 kg)  Height: 5' (1.524 m)  PainSc: 0-No pain    GENERAL: Well-appearing, in NAD. Well nourished.  SKIN: Pink, warm and dry. No rash, lesion, ulceration, or ecchymoses.  Head: Normocephalic. NECK: Trachea midline. Full ROM w/o pain or tenderness.  RESPIRATORY: Chest wall symmetrical. Respirations even and non-labored. Breath sounds clear to auscultation bilaterally.  CARDIAC: S1, S2 present, regular rate and rhythm without murmur or gallops. Peripheral pulses 2+ bilaterally.  MSK: Muscle tone and strength appropriate for age.  EXTREMITIES: Without clubbing, cyanosis, or edema.  NEUROLOGIC: No motor or sensory deficits. Steady, even gait. C2-C12 intact.  PSYCH/MENTAL STATUS: Alert, oriented x 3. Cooperative, appropriate mood and affect.   Diabetic Foot Exam - Simple   Simple Foot Form Diabetic Foot exam was performed with the following findings: Yes 12/14/2022 10:24 AM  Visual Inspection No deformities, no ulcerations, no other skin breakdown bilaterally: Yes Sensation Testing Intact to touch and monofilament testing bilaterally: Yes Pulse Check Posterior Tibialis and Dorsalis pulse intact bilaterally: Yes Comments     Results for orders placed or  performed in visit on 12/14/22  POCT Glucose (CBG)  Result Value Ref Range   POC Glucose 205 (A) 70 - 99 mg/dl       Assessment & Plan:   1. Type 2 diabetes mellitus with hyperglycemia, without long-term current use of insulin (HCC) Discussed diabetes disease process, risk factors, diet and exercise needed changes, and management of glucose monitoring and log with patient. Nutrition referral placed. Will obtain urine albumin and labs today as patient is fasting. Fasting Glucose was 205 in office today. Will start Metformin 500mg  XR BID and repeat fasting glucose in office in 6 weeks. Pt would like to try nutrition and metformin prior to starting insulin therapy. Glucometer supplies sent in and pt to return with log in 6 weeks.   - Referral to Nutrition and Diabetes Services - Lipid panel - Comprehensive metabolic panel - Microalbumin / creatinine urine ratio - metFORMIN (GLUCOPHAGE-XR) 500 MG 24 hr tablet; Take 1 tablet (500 mg total) by mouth 2 (two) times daily with a meal.  Dispense: 180 tablet; Refill: 3 - Blood Glucose Monitoring Suppl DEVI; 1 each by Does not apply route in the morning, at noon, and at bedtime. May substitute to any manufacturer covered by patient's insurance.  Dispense: 1 each; Refill: 0 - Glucose Blood (BLOOD GLUCOSE TEST STRIPS) STRP; 1 each by In Vitro route in the morning, at noon, and at bedtime. May substitute to any manufacturer covered by patient's insurance.  Dispense: 100 strip; Refill: 3 - Lancet Device MISC; 1 each by Does not apply route in the morning, at noon, and at bedtime. May substitute to any manufacturer covered by patient's insurance.  Dispense: 1 each; Refill: 3 - Lancets Misc. MISC; 1 each by Does not apply route in the morning, at noon, and at bedtime. May substitute to any manufacturer covered by patient's insurance.  Dispense: 100 each; Refill: 0 - POCT Glucose (CBG)  2. Vitamin D deficiency Pt has hx of vitamin d deficiency needing  replacement. Will obtain lab and order replacement pending result.  - VITAMIN D 25 Hydroxy (Vit-D Deficiency, Fractures)  3. Anemia, unspecified type Pt has hx of anemia and b12 deficiency. Will obtain lab and order replacement pending result.  - CBC with Differential/Platelet - Iron, TIBC and Ferritin Panel - Vitamin B12   Patient to reach out to office if new, worrisome, or unresolved symptoms arise or if no improvement in patient's condition. Patient verbalized understanding and is agreeable to treatment plan. All questions answered to patient's satisfaction.    Return in about 6 weeks (around 01/25/2023) for DIABETES CHECK UP.    Yolanda Manges, FNP

## 2022-12-15 LAB — COMPREHENSIVE METABOLIC PANEL
ALT: 15 [IU]/L (ref 0–32)
AST: 26 [IU]/L (ref 0–40)
Albumin: 4.5 g/dL (ref 4.0–5.0)
Alkaline Phosphatase: 72 [IU]/L (ref 44–121)
BUN/Creatinine Ratio: 15 (ref 9–23)
BUN: 7 mg/dL (ref 6–20)
Bilirubin Total: 0.3 mg/dL (ref 0.0–1.2)
CO2: 17 mmol/L — ABNORMAL LOW (ref 20–29)
Calcium: 9.6 mg/dL (ref 8.7–10.2)
Chloride: 100 mmol/L (ref 96–106)
Creatinine, Ser: 0.48 mg/dL — ABNORMAL LOW (ref 0.57–1.00)
Globulin, Total: 3 g/dL (ref 1.5–4.5)
Glucose: 230 mg/dL — ABNORMAL HIGH (ref 70–99)
Potassium: 4.3 mmol/L (ref 3.5–5.2)
Sodium: 140 mmol/L (ref 134–144)
Total Protein: 7.5 g/dL (ref 6.0–8.5)
eGFR: 136 mL/min/{1.73_m2} (ref >59)

## 2022-12-15 LAB — IRON,TIBC AND FERRITIN PANEL
Ferritin: 298 ng/mL — ABNORMAL HIGH (ref 15–150)
Iron Saturation: 21 % (ref 15–55)
Iron: 76 ug/dL (ref 27–159)
Total Iron Binding Capacity: 356 ug/dL (ref 250–450)
UIBC: 280 ug/dL (ref 131–425)

## 2022-12-15 LAB — MICROALBUMIN / CREATININE URINE RATIO
Creatinine, Urine: 148.2 mg/dL
Microalb/Creat Ratio: 5 mg/g{creat} (ref 0–29)
Microalbumin, Urine: 7.9 ug/mL

## 2022-12-15 LAB — LIPID PANEL
Chol/HDL Ratio: 5.1 ratio — ABNORMAL HIGH (ref 0.0–4.4)
Cholesterol, Total: 216 mg/dL — ABNORMAL HIGH (ref 100–199)
HDL: 42 mg/dL (ref >39)
LDL Chol Calc (NIH): 146 mg/dL — ABNORMAL HIGH (ref 0–99)
Triglycerides: 152 mg/dL — ABNORMAL HIGH (ref 0–149)
VLDL Cholesterol Cal: 28 mg/dL (ref 5–40)

## 2022-12-15 LAB — CBC WITH DIFFERENTIAL/PLATELET
Basophils Absolute: 0 10*3/uL (ref 0.0–0.2)
Basos: 1 %
EOS (ABSOLUTE): 0 10*3/uL (ref 0.0–0.4)
Eos: 0 %
Hematocrit: 44.9 % (ref 34.0–46.6)
Hemoglobin: 14.7 g/dL (ref 11.1–15.9)
Immature Grans (Abs): 0 10*3/uL (ref 0.0–0.1)
Immature Granulocytes: 0 %
Lymphocytes Absolute: 1.3 10*3/uL (ref 0.7–3.1)
Lymphs: 38 %
MCH: 28.1 pg (ref 26.6–33.0)
MCHC: 32.7 g/dL (ref 31.5–35.7)
MCV: 86 fL (ref 79–97)
Monocytes Absolute: 0.5 10*3/uL (ref 0.1–0.9)
Monocytes: 14 %
Neutrophils Absolute: 1.6 10*3/uL (ref 1.4–7.0)
Neutrophils: 47 %
Platelets: 304 10*3/uL (ref 150–450)
RBC: 5.23 x10E6/uL (ref 3.77–5.28)
RDW: 12.5 % (ref 11.7–15.4)
WBC: 3.3 10*3/uL — ABNORMAL LOW (ref 3.4–10.8)

## 2022-12-15 LAB — VITAMIN D 25 HYDROXY (VIT D DEFICIENCY, FRACTURES): Vit D, 25-Hydroxy: 12.5 ng/mL — ABNORMAL LOW (ref 30.0–100.0)

## 2022-12-15 LAB — VITAMIN B12: Vitamin B-12: 783 pg/mL (ref 232–1245)

## 2022-12-18 ENCOUNTER — Encounter (HOSPITAL_BASED_OUTPATIENT_CLINIC_OR_DEPARTMENT_OTHER): Payer: Self-pay | Admitting: Family Medicine

## 2022-12-18 ENCOUNTER — Other Ambulatory Visit (HOSPITAL_BASED_OUTPATIENT_CLINIC_OR_DEPARTMENT_OTHER): Payer: Self-pay | Admitting: *Deleted

## 2022-12-18 DIAGNOSIS — E1165 Type 2 diabetes mellitus with hyperglycemia: Secondary | ICD-10-CM

## 2022-12-18 MED ORDER — BLOOD GLUCOSE TEST VI STRP: 1.0000 | ORAL_STRIP | Freq: Three times a day (TID) | 3 refills | Status: AC

## 2022-12-19 ENCOUNTER — Other Ambulatory Visit (HOSPITAL_BASED_OUTPATIENT_CLINIC_OR_DEPARTMENT_OTHER): Payer: Self-pay | Admitting: Family Medicine

## 2022-12-19 DIAGNOSIS — E559 Vitamin D deficiency, unspecified: Secondary | ICD-10-CM

## 2022-12-19 DIAGNOSIS — E7800 Pure hypercholesterolemia, unspecified: Secondary | ICD-10-CM | POA: Insufficient documentation

## 2022-12-19 MED ORDER — CHOLECALCIFEROL 1.25 MG (50000 UT) PO CAPS: 50000.0000 [IU] | ORAL_CAPSULE | ORAL | 0 refills | Status: DC

## 2022-12-19 NOTE — Progress Notes (Signed)
Hi Kathleen Barnes,  We will discuss your labs at your visit in December but I wanted to go ahead and discuss them through MyChart as well.  Your cholesterol is elevated.  This is likely due to the ongoing diabetes and increased blood sugar levels.  I would like you to try dietary changes first before going on a statin medication and see if we can lower it with diet and exercise.  Please avoid greasy/fatty foods. Adhere to a diet with lean proteins, vegetables, fruits, and low carbohydrates. Drink plenty of water. Regular exercise.  Your blood counts are normal.  Please make sure that you are drinking clear fluids regularly to help support your kidney function.  Your electrolytes are normal.  Your vitamin D is low.  I will send in a weekly replacement for you to take and we will recheck this in about 6 months.  Your B12 was normal.  Your iron levels were normal but your ferritin level was elevated.  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  I would recommend that we recheck this in about 6 months.  How are you doing with the metformin?  How have your blood sugars been?

## 2023-01-01 ENCOUNTER — Other Ambulatory Visit (HOSPITAL_BASED_OUTPATIENT_CLINIC_OR_DEPARTMENT_OTHER): Payer: Self-pay | Admitting: Family Medicine

## 2023-01-01 MED ORDER — FLUCONAZOLE 150 MG PO TABS: 150.0000 mg | ORAL_TABLET | Freq: Once | ORAL | 0 refills | Status: AC

## 2023-01-03 ENCOUNTER — Other Ambulatory Visit (HOSPITAL_BASED_OUTPATIENT_CLINIC_OR_DEPARTMENT_OTHER): Payer: Self-pay | Admitting: Family Medicine

## 2023-01-03 DIAGNOSIS — E1165 Type 2 diabetes mellitus with hyperglycemia: Secondary | ICD-10-CM

## 2023-01-03 DIAGNOSIS — D649 Anemia, unspecified: Secondary | ICD-10-CM

## 2023-01-03 MED ORDER — OZEMPIC (0.25 OR 0.5 MG/DOSE) 2 MG/3ML ~~LOC~~ SOPN: PEN_INJECTOR | SUBCUTANEOUS | 1 refills | Status: DC

## 2023-01-25 ENCOUNTER — Ambulatory Visit (INDEPENDENT_AMBULATORY_CARE_PROVIDER_SITE_OTHER): Payer: No Typology Code available for payment source | Admitting: Family Medicine

## 2023-01-25 ENCOUNTER — Encounter (HOSPITAL_BASED_OUTPATIENT_CLINIC_OR_DEPARTMENT_OTHER): Payer: Self-pay | Admitting: Family Medicine

## 2023-01-25 VITALS — BP 113/83 | HR 101 | Ht 60.0 in | Wt 152.8 lb

## 2023-01-25 DIAGNOSIS — B370 Candidal stomatitis: Secondary | ICD-10-CM | POA: Diagnosis not present

## 2023-01-25 DIAGNOSIS — E1165 Type 2 diabetes mellitus with hyperglycemia: Secondary | ICD-10-CM

## 2023-01-25 MED ORDER — NYSTATIN 100000 UNIT/ML MT SUSP: OROMUCOSAL | 1 refills | Status: DC

## 2023-01-25 NOTE — Patient Instructions (Addendum)
Please increase your Ozempic to 0.5 mg for 4 weeks. There is one refill on your pen left. Let  me know prior to running out how glucose levels are and we will will increase your dosage if needed or keep you at 0.5mg 

## 2023-01-25 NOTE — Progress Notes (Signed)
Subjective:   Kathleen Barnes 2000/03/08 01/25/2023  Chief Complaint  Patient presents with   Diabetes    Patient is following up for diabetes.    HPI: Kathleen Barnes presents today for re-assessment and management of chronic medical conditions.  DIABETES MELLITUS: Kathleen Barnes presents for the medical management of diabetes. She has an appt with DM Nutrition 02/19/23  Current diabetes medication regimen: Ozempic 0.25mg  and increasing to 0.5mg   Patient is  adhering to a diabetic diet.  Patient is  exercising regularly.  Patient is  checking BS regularly.   Average Glucose Readings:  Glucose - Morning readings prior to breakfast 150's - After eating: 150-170's on average  Prior to starting her Ozempic glucose was averaging 200-250 daily.   Patient is  checking their feet regularly.  Denies polydipsia, polyphagia, polyuria, open wounds or ulcers on feet. She denies side effects from Ozempic currently. She has noticed some mild diarrhea initially but this has resolved.   Patient did notice one episode of Thrush that occurred and resolved spontaneously before starting her Ozempic. She does notice some mild vaginal discharge intermittently. Denies bleeding, itching, lesions, or rash. Declines vaginal swab testing.   Lab Results  Component Value Date   HGBA1C 12.0 (H) 12/07/2022    12/14/2022 Lab Results  Component Value Date   LABMICR 7.9 12/14/2022   LABMICR Comment 04/24/2019    Wt Readings from Last 3 Encounters:  01/25/23 152 lb 12.8 oz (69.3 kg)  12/14/22 154 lb 9.6 oz (70.1 kg)  12/07/22 153 lb 6.4 oz (69.6 kg)     The following portions of the patient's history were reviewed and updated as appropriate: past medical history, past surgical history, family history, social history, allergies, medications, and problem list.   Patient Active Problem List   Diagnosis Date Noted   Elevated cholesterol 12/19/2022   Type 2 diabetes mellitus with hyperglycemia,  without long-term current use of insulin (HCC) 12/14/2022   Vitamin D deficiency 12/14/2022   Anemia 11/26/2019   B12 deficiency 11/26/2019   Acute blood loss anemia 11/08/2019   Normal labor 11/04/2019   PPH (postpartum hemorrhage) 11/04/2019   Abnormal glucose tolerance test (GTT) 09/12/2019   Marginal insertion of umbilical cord affecting management of mother 07/17/2019   Echogenic intracardiac focus of fetus on prenatal ultrasound 07/17/2019   High risk teen pregnancy, antepartum 05/16/2019   Maternal varicella, non-immune 05/16/2019   History of chlamydia infection 10/16/2017   Past Medical History:  Diagnosis Date   Gestational diabetes    History of chlamydia infection 10/16/2017   Past Surgical History:  Procedure Laterality Date   EMBOLIZATION (CATH LAB) N/A 11/05/2019   Procedure: EMBOLIZATION;  Surgeon: Annice Needy, MD;  Location: [facility] INVASIVE CV LAB;  Service: Cardiovascular;  Laterality: N/A;   Family History  Problem Relation Age of Onset   Hypertension Mother    Healthy Father    Heart attack Maternal Grandmother    Migraines Maternal Grandmother    Breast cancer Neg Hx    Ovarian cancer Neg Hx    Colon cancer Neg Hx    Outpatient Medications Prior to Visit  Medication Sig Dispense Refill   Blood Glucose Monitoring Suppl DEVI 1 each by Does not apply route in the morning, at noon, and at bedtime. May substitute to any manufacturer covered by patient's insurance. 1 each 0   Cholecalciferol 1.25 MG (50000 UT) capsule Take 1 capsule (50,000 Units total) by mouth once a week.  12 capsule 0   Glucose Blood (BLOOD GLUCOSE TEST STRIPS) STRP 1 each by In Vitro route in the morning, at noon, and at bedtime. May substitute to any manufacturer covered by patient's insurance. 100 strip 3   Semaglutide,0.25 or 0.5MG /DOS, (OZEMPIC, 0.25 OR 0.5 MG/DOSE,) 2 MG/3ML SOPN Inject 0.25 mg into the skin once a week for 30 days, THEN 0.5 mg once a week. 3 mL 1   metFORMIN  (GLUCOPHAGE-XR) 500 MG 24 hr tablet Take 1 tablet (500 mg total) by mouth 2 (two) times daily with a meal. 180 tablet 3   No facility-administered medications prior to visit.   No Known Allergies   ROS: A complete ROS was performed with pertinent positives/negatives noted in the HPI. The remainder of the ROS are negative.    Objective:   Today's Vitals   01/25/23 1002  BP: 113/83  Pulse: (!) 101  SpO2: 99%  Weight: 152 lb 12.8 oz (69.3 kg)  Height: 5' (1.524 m)    Physical Exam          GENERAL: Well-appearing, in NAD. Well nourished.  SKIN: Pink, warm and dry. No rash, lesion, ulceration, or ecchymoses.  Head: Normocephalic. NECK: Trachea midline. Full ROM w/o pain or tenderness.  EYES: Conjunctiva clear without exudates. EOMI, PERRL, no drainage present.  THROAT: Uvula midline. Oropharynx clear. Tonsils non-inflamed without exudate. Mucous membranes pink and moist.  RESPIRATORY: Chest wall symmetrical. Respirations even and non-labored.  MSK: Muscle tone and strength appropriate for age. Joints w/o tenderness, redness, or swelling.  EXTREMITIES: Without clubbing, cyanosis, or edema.  NEUROLOGIC: No motor or sensory deficits. Steady, even gait. C2-C12 intact.  PSYCH/MENTAL STATUS: Alert, oriented x 3. Cooperative, appropriate mood and affect.   Health Maintenance Due  Topic Date Due   OPHTHALMOLOGY EXAM  Never done   HPV VACCINES (1 - 3-dose series) Never done      Assessment & Plan:  1. Type 2 diabetes mellitus with hyperglycemia, without long-term current use of insulin (HCC) (Primary) Patient doing exceptionally well with diet, exercise and management of diabetes.  She has had significant improvement in her blood glucose readings daily with changes in her diet, exercise and using Ozempic 0.25 mg for the past 3 weeks.  She will finish her last dose of Ozempic and increase to 0.5 mg weekly for the next 4 weeks.  Patient to monitor blood sugar and if glucose levels are  over 140-150 consistently, we will increase this dosage to 1 mg and patient will reach out to PCP to let her know.  Discussed good diet, exercise, footcare, and referral placed for ophthalmology for diabetic eye exam.  Patient congratulated on control and we will obtain A1c within the next 2 to 3 months.  - Ambulatory referral to Ophthalmology  2. Oral thrush Will send in nystatin for patient to use as needed if thrush returns.  Discussed importance of oral hygiene and possibility of thrush due to uncontrolled glucose, patient verbalized understanding. - nystatin (MYCOSTATIN) 100000 UNIT/ML suspension; Swish and swallow 5mL by mouth four times a day for 7 days for fungal infection.  Dispense: 60 mL; Refill: 1  Meds ordered this encounter  Medications   nystatin (MYCOSTATIN) 100000 UNIT/ML suspension    Sig: Swish and swallow 5mL by mouth four times a day for 7 days for fungal infection.    Dispense:  60 mL    Refill:  1    Supervising Provider:   Tommi Rumps [place], RAYMOND J [[id number]]   Return  in about 3 months (around 04/25/2023) for DIABETES CHECK UP (fasting labs at visit) .    Patient to reach out to office if new, worrisome, or unresolved symptoms arise or if no improvement in patient's condition. Patient verbalized understanding and is agreeable to treatment plan. All questions answered to patient's satisfaction.    Hilbert Bible, [place]

## 2023-02-19 ENCOUNTER — Ambulatory Visit: Payer: No Typology Code available for payment source | Admitting: Skilled Nursing Facility1

## 2023-03-03 ENCOUNTER — Other Ambulatory Visit (HOSPITAL_BASED_OUTPATIENT_CLINIC_OR_DEPARTMENT_OTHER): Payer: Self-pay | Admitting: Family Medicine

## 2023-03-03 DIAGNOSIS — E1165 Type 2 diabetes mellitus with hyperglycemia: Secondary | ICD-10-CM

## 2023-03-06 ENCOUNTER — Other Ambulatory Visit (HOSPITAL_BASED_OUTPATIENT_CLINIC_OR_DEPARTMENT_OTHER): Payer: Self-pay | Admitting: Family Medicine

## 2023-03-06 NOTE — Telephone Encounter (Signed)
Please advise if you want to have medication refilled.

## 2023-04-25 ENCOUNTER — Encounter (HOSPITAL_BASED_OUTPATIENT_CLINIC_OR_DEPARTMENT_OTHER): Payer: Self-pay | Admitting: Family Medicine

## 2023-04-25 ENCOUNTER — Ambulatory Visit (INDEPENDENT_AMBULATORY_CARE_PROVIDER_SITE_OTHER): Payer: No Typology Code available for payment source | Admitting: Family Medicine

## 2023-04-25 VITALS — BP 123/87 | HR 100 | Ht 60.0 in | Wt 147.6 lb

## 2023-04-25 DIAGNOSIS — E1165 Type 2 diabetes mellitus with hyperglycemia: Secondary | ICD-10-CM | POA: Diagnosis not present

## 2023-04-25 DIAGNOSIS — E7800 Pure hypercholesterolemia, unspecified: Secondary | ICD-10-CM | POA: Diagnosis not present

## 2023-04-25 DIAGNOSIS — R7989 Other specified abnormal findings of blood chemistry: Secondary | ICD-10-CM

## 2023-04-25 DIAGNOSIS — E559 Vitamin D deficiency, unspecified: Secondary | ICD-10-CM

## 2023-04-25 NOTE — Patient Instructions (Signed)
 Ophthalmology Offices   [facility]  [address]  [place], [place] [address] [phone]  [facility] Ophthalmology [address] [place], [place] [address] Phone: [phone]  [facility] [address] [place], [place] [address] Phone: [phone]  [facility]  Optometrist [address]  [phone]  [facility] Optometrist [address]  [phone]

## 2023-04-25 NOTE — Progress Notes (Signed)
 Subjective:   Kathleen Barnes 2000/11/09 04/25/2023  Chief Complaint  Patient presents with   Follow-up    Follow up on Diabetes, pt is fasting     HPI: Izzabelle S Pedigo presents today for re-assessment and management of chronic medical conditions.  DIABETES MELLITUS: Roe S Najarian presents for the medical management of diabetes.  Current diabetes medication regimen: Ozempic 0.5mg   Patient is  adhering to a diabetic diet.  Patient is  exercising regularly.  Patient is  checking BS regularly. Avg: 120's in the morning at fasting; 150-160 after meals.  Patient is  checking their feet regularly.  Denies polydipsia, polyphagia, polyuria, open wounds or ulcers on feet.   Lab Results  Component Value Date   HGBA1C 12.0 (H) 12/07/2022    Foot Exam: 12/14/2022 Lab Results  Component Value Date   LABMICR 7.9 12/14/2022   LABMICR Comment 04/24/2019    Wt Readings from Last 3 Encounters:  04/25/23 147 lb 9.6 oz (67 kg)  01/25/23 152 lb 12.8 oz (69.3 kg)  12/14/22 154 lb 9.6 oz (70.1 kg)    VITAMIN D DEFICIENCY: Shaquilla S Amburn presents for the medical management of Vitamin D deficiency.  Current regimen: Cholecalciferol 50,000 units weekly   Complaint with regimen: Yes Up to date DEXA:N/a  Denies recent falls or injury.  Last vitamin D Lab Results  Component Value Date   VD25OH 12.5 (L) 12/14/2022    HYPERLIPIDEMIA: Wilmary S Oberholzer presents for the medical management of hyperlipidemia associated with type 2 diabetes mellitus.  Patient's current HLD regimen is: Diet, Exercise.  Patient has made considerable changes including a heart healthy diet, avoiding carbohydrates and sugar, and regular exercise to lower her cardiovascular risk.  Lab Results  Component Value Date   CHOL 216 (H) 12/14/2022   HDL 42 12/14/2022   LDLCALC 146 (H) 12/14/2022   TRIG 152 (H) 12/14/2022   CHOLHDL 5.1 (H) 12/14/2022     The following portions of the patient's history were reviewed and  updated as appropriate: past medical history, past surgical history, family history, social history, allergies, medications, and problem list.   Patient Active Problem List   Diagnosis Date Noted   Elevated cholesterol 12/19/2022   Type 2 diabetes mellitus with hyperglycemia, without long-term current use of insulin (HCC) 12/14/2022   Vitamin D deficiency 12/14/2022   Anemia 11/26/2019   B12 deficiency 11/26/2019   Past Medical History:  Diagnosis Date   Abnormal glucose tolerance test (GTT) 09/12/2019   Acute blood loss anemia 11/08/2019   Echogenic intracardiac focus of fetus on prenatal ultrasound 07/17/2019   Gestational diabetes    High risk teen pregnancy, antepartum 05/16/2019   History of chlamydia infection 10/16/2017   Marginal insertion of umbilical cord affecting management of mother 07/17/2019   Maternal varicella, non-immune 05/16/2019   Normal labor 11/04/2019   PPH (postpartum hemorrhage) 11/04/2019   Past Surgical History:  Procedure Laterality Date   EMBOLIZATION (CATH LAB) N/A 11/05/2019   Procedure: EMBOLIZATION;  Surgeon: Annice Needy, MD;  Location: [facility] INVASIVE CV LAB;  Service: Cardiovascular;  Laterality: N/A;   Family History  Problem Relation Age of Onset   Hypertension Mother    Healthy Father    Heart attack Maternal Grandmother    Migraines Maternal Grandmother    Breast cancer Neg Hx    Ovarian cancer Neg Hx    Colon cancer Neg Hx    Outpatient Medications Prior to Visit  Medication Sig Dispense Refill  OZEMPIC, 0.25 OR 0.5 MG/DOSE, 2 MG/3ML SOPN Inject 0.5 mg into the skin once a week.     Blood Glucose Monitoring Suppl DEVI 1 each by Does not apply route in the morning, at noon, and at bedtime. May substitute to any manufacturer covered by patient's insurance. 1 each 0   Cholecalciferol (VITAMIN D3) 1.25 MG (50000 UT) CAPS TAKE 1 CAPSULE BY MOUTH ONE TIME PER WEEK 12 capsule 1   Glucose Blood (BLOOD GLUCOSE TEST STRIPS) STRP 1 each by In  Vitro route in the morning, at noon, and at bedtime. May substitute to any manufacturer covered by patient's insurance. 100 strip 3   nystatin (MYCOSTATIN) 100000 UNIT/ML suspension Swish and swallow 5mL by mouth four times a day for 7 days for fungal infection. 60 mL 1   No facility-administered medications prior to visit.   Not on File   ROS: A complete ROS was performed with pertinent positives/negatives noted in the HPI. The remainder of the ROS are negative.    Objective:   Today's Vitals   04/25/23 0856  BP: 123/87  Pulse: 100  SpO2: 99%  Weight: 147 lb 9.6 oz (67 kg)  Height: 5' (1.524 m)  PainSc: 0-No pain    Physical Exam          GENERAL: Well-appearing, in NAD. Well nourished.  SKIN: Pink, warm and dry.  Head: Normocephalic. NECK: Trachea midline. Full ROM w/o pain or tenderness.  RESPIRATORY: Chest wall symmetrical. Respirations even and non-labored.  MSK: Muscle tone and strength appropriate for age.  NEUROLOGIC: No motor or sensory deficits. Steady, even gait. C2-C12 intact.  PSYCH/MENTAL STATUS: Alert, oriented x 3. Cooperative, appropriate mood and affect.   Health Maintenance Due  Topic Date Due   OPHTHALMOLOGY EXAM  Never done      Assessment & Plan:  1. Type 2 diabetes mellitus with hyperglycemia, without long-term current use of insulin (HCC) (Primary) Based upon patient's glucose readings at home, A1c should be improved with starting of Ozempic.  Will titrate patient's Ozempic pending A1c result.  Patient doing well with diet, exercise.  Urine albumin and foot exam up-to-date.  Patient provide ophthalmology offices to establish care for diabetic eye exam. - Hemoglobin A1c  2. Vitamin D deficiency Will assess with vitamin D lab work today.  Will anticipate continuing vitamin D replacement given history of deficiency. - VITAMIN D 25 Hydroxy (Vit-D Deficiency, Fractures)  3. Elevated cholesterol Previously uncontrolled with uncontrolled type 2  diabetes.  Patient has made considerable dietary and exercise changes and anticipate cholesterol has improved.  Will check with fasting lipid panel today.  reviewed heart healthy diet with patient - Lipid panel  4. Elevated ferritin Ferritin previously elevated likely due to inflammation and the metabolic syndrome given uncontrolled type 2 diabetes.  Patient does have a history of anemia, is asymptomatic currently.  Will evaluate with repeat iron, TIBC and ferritin level today. - Iron, TIBC and Ferritin Panel   No orders of the defined types were placed in this encounter.  Lab Orders         Lipid panel         Iron, TIBC and Ferritin Panel         Hemoglobin A1c         VITAMIN D 25 Hydroxy (Vit-D Deficiency, Fractures)      Return in about 6 months (around 10/26/2023) for ANNUAL PHYSICAL, DIABETES CHECK UP.    Patient to reach out to office if new, worrisome,  or unresolved symptoms arise or if no improvement in patient's condition. Patient verbalized understanding and is agreeable to treatment plan. All questions answered to patient's satisfaction.    Hilbert Bible, [place]

## 2023-04-26 LAB — LIPID PANEL
Chol/HDL Ratio: 4.2 ratio (ref 0.0–4.4)
Cholesterol, Total: 158 mg/dL (ref 100–199)
HDL: 38 mg/dL — ABNORMAL LOW (ref >39)
LDL Chol Calc (NIH): 106 mg/dL — ABNORMAL HIGH (ref 0–99)
Triglycerides: 71 mg/dL (ref 0–149)
VLDL Cholesterol Cal: 14 mg/dL (ref 5–40)

## 2023-04-26 LAB — IRON,TIBC AND FERRITIN PANEL
Ferritin: 332 ng/mL — ABNORMAL HIGH (ref 15–150)
Iron Saturation: 35 % (ref 15–55)
Iron: 104 ug/dL (ref 27–159)
Total Iron Binding Capacity: 296 ug/dL (ref 250–450)
UIBC: 192 ug/dL (ref 131–425)

## 2023-04-26 LAB — HEMOGLOBIN A1C
Est. average glucose Bld gHb Est-mCnc: 148 mg/dL
Hgb A1c MFr Bld: 6.8 % — ABNORMAL HIGH (ref 4.8–5.6)

## 2023-04-26 LAB — VITAMIN D 25 HYDROXY (VIT D DEFICIENCY, FRACTURES): Vit D, 25-Hydroxy: 38.3 ng/mL (ref 30.0–100.0)

## 2023-04-27 ENCOUNTER — Encounter (HOSPITAL_BASED_OUTPATIENT_CLINIC_OR_DEPARTMENT_OTHER): Payer: Self-pay | Admitting: Family Medicine

## 2023-04-27 NOTE — Progress Notes (Signed)
 Hi Kathleen Barnes,  You have significantly improved your A1c (measurement for diabetes management).  Your A1c has reduced from 12.0 down to 6.8 in the past 4 months.  Under 7.0 is considered well-controlled diabetes.  I would recommend continuing on your current medications and we can recheck this in 6 months.  Your cholesterol has significantly also improved with control of diabetes.  Your ferritin level is still likely elevated due to ongoing diabetes.  We can recheck this at your next visit.  Your vitamin D has also improved at 38.3.  You have any further questions please let me know

## 2023-06-01 ENCOUNTER — Encounter (HOSPITAL_BASED_OUTPATIENT_CLINIC_OR_DEPARTMENT_OTHER): Payer: Self-pay | Admitting: Family Medicine

## 2023-07-06 ENCOUNTER — Other Ambulatory Visit (HOSPITAL_BASED_OUTPATIENT_CLINIC_OR_DEPARTMENT_OTHER): Payer: Self-pay | Admitting: Family Medicine

## 2023-07-06 DIAGNOSIS — E1165 Type 2 diabetes mellitus with hyperglycemia: Secondary | ICD-10-CM

## 2023-07-24 ENCOUNTER — Ambulatory Visit
Admission: EM | Admit: 2023-07-24 | Discharge: 2023-07-24 | Disposition: A | Discharge status: Home / Self Care | Attending: Emergency Medicine | Admitting: Emergency Medicine

## 2023-07-24 DIAGNOSIS — Z3202 Encounter for pregnancy test, result negative: Secondary | ICD-10-CM | POA: Diagnosis present

## 2023-07-24 DIAGNOSIS — N898 Other specified noninflammatory disorders of vagina: Secondary | ICD-10-CM | POA: Insufficient documentation

## 2023-07-24 DIAGNOSIS — Z113 Encounter for screening for infections with a predominantly sexual mode of transmission: Secondary | ICD-10-CM | POA: Insufficient documentation

## 2023-07-24 LAB — POCT URINE PREGNANCY: Preg Test, Ur: NEGATIVE

## 2023-07-24 MED ORDER — METRONIDAZOLE 0.75 % VA GEL
1.0000 | Freq: Every day | VAGINAL | 0 refills | Status: AC
Start: 2023-07-24 — End: 2023-07-29

## 2023-07-24 NOTE — ED Provider Notes (Signed)
 Kathleen Barnes    CSN: [id number] Arrival date & time: 07/24/23  1744      History   Chief Complaint Chief Complaint  Patient presents with   SEXUALLY TRANSMITTED DISEASE    HPI Kathleen Barnes is a 23 y.o. female.  Patient presents with 1 week history of malodorous vaginal discharge.  She reports this is similar to previous episodes of bacterial vaginitis.  She denies fever, rash, vaginal itching, pelvic pain, abdominal pain, dysuria, hematuria.  No treatments at home.  The history is provided by the patient and medical records.    Past Medical History:  Diagnosis Date   Abnormal glucose tolerance test (GTT) 09/12/2019   Acute blood loss anemia 11/08/2019   Echogenic intracardiac focus of fetus on prenatal ultrasound 07/17/2019   Gestational diabetes    High risk teen pregnancy, antepartum 05/16/2019   History of chlamydia infection 10/16/2017   Marginal insertion of umbilical cord affecting management of mother 07/17/2019   Maternal varicella, non-immune 05/16/2019   Normal labor 11/04/2019   PPH (postpartum hemorrhage) 11/04/2019    Patient Active Problem List   Diagnosis Date Noted   Elevated cholesterol 12/19/2022   Type 2 diabetes mellitus with hyperglycemia, without long-term current use of insulin  (HCC) 12/14/2022   Vitamin D  deficiency 12/14/2022   Anemia 11/26/2019   B12 deficiency 11/26/2019    Past Surgical History:  Procedure Laterality Date   EMBOLIZATION (CATH LAB) N/A 11/05/2019   Procedure: EMBOLIZATION;  Surgeon: Marea Selinda RAMAN, MD;  Location: [facility] INVASIVE CV LAB;  Service: Cardiovascular;  Laterality: N/A;    OB History     Gravida  1   Para  1   Term  1   Preterm      AB      Living  1      SAB      IAB      Ectopic      Multiple  0   Live Births  1            Home Medications    Prior to Admission medications   Medication Sig Start Date End Date Taking? Authorizing Provider  metroNIDAZOLE  (METROGEL ) 0.75 %  vaginal gel Place 1 Applicatorful vaginally at bedtime for 5 days. 07/24/23 07/29/23 Yes Corlis Burnard DEL, NP  Blood Glucose Monitoring Suppl DEVI 1 each by Does not apply route in the morning, at noon, and at bedtime. May substitute to any manufacturer covered by patient's insurance. 12/14/22   Caudle, Thersia Bitters, FNP  Cholecalciferol  (VITAMIN D3) 1.25 MG (50000 UT) CAPS TAKE 1 CAPSULE BY MOUTH ONE TIME PER WEEK 03/06/23   Caudle, Thersia Bitters, FNP  Glucose Blood (BLOOD GLUCOSE TEST STRIPS) STRP 1 each by In Vitro route in the morning, at noon, and at bedtime. May substitute to any manufacturer covered by patient's insurance. 12/18/22   Caudle, Thersia Bitters, FNP  Semaglutide ,0.25 or 0.5MG /DOS, (OZEMPIC , 0.25 OR 0.5 MG/DOSE,) 2 MG/3ML SOPN INJECT 0.5 MG INTO THE SKIN ONE TIME PER WEEK 07/06/23   Caudle, Thersia Bitters, FNP    Family History Family History  Problem Relation Age of Onset   Hypertension Mother    Healthy Father    Heart attack Maternal Grandmother    Migraines Maternal Grandmother    Breast cancer Neg Hx    Ovarian cancer Neg Hx    Colon cancer Neg Hx     Social History Social History   Tobacco Use   Smoking status: Never  Passive exposure: Never   Smokeless tobacco: Never  Vaping Use   Vaping status: Every Day   Substances: Nicotine   Substance Use Topics   Alcohol use: Yes    Comment: soc   Drug use: No     Allergies   Patient has no known allergies.   Review of Systems Review of Systems  Constitutional:  Negative for chills and fever.  Gastrointestinal:  Negative for abdominal pain.  Genitourinary:  Positive for vaginal discharge. Negative for dysuria, flank pain, hematuria and pelvic pain.  Skin:  Negative for color change and rash.     Physical Exam Triage Vital Signs ED Triage Vitals  Encounter Vitals Group     BP      Girls Systolic BP Percentile      Girls Diastolic BP Percentile      Boys Systolic BP Percentile      Boys Diastolic BP  Percentile      Pulse      Resp      Temp      Temp src      SpO2      Weight      Height      Head Circumference      Peak Flow      Pain Score      Pain Loc      Pain Education      Exclude from Growth Chart    No data found.  Updated Vital Signs LMP 07/14/2023   Visual Acuity Right Eye Distance:   Left Eye Distance:   Bilateral Distance:    Right Eye Near:   Left Eye Near:    Bilateral Near:     Physical Exam Constitutional:      General: She is not in acute distress. HENT:     Mouth/Throat:     Mouth: Mucous membranes are moist.   Cardiovascular:     Rate and Rhythm: Normal rate and regular rhythm.  Pulmonary:     Effort: Pulmonary effort is normal. No respiratory distress.  Abdominal:     Palpations: Abdomen is soft.     Tenderness: There is no abdominal tenderness. There is no right CVA tenderness, left CVA tenderness, guarding or rebound.  Genitourinary:    Comments: Patient declines GU exam.   Neurological:     Mental Status: She is alert.      UC Treatments / Results  Labs (all labs ordered are listed, but only abnormal results are displayed) Labs Reviewed  POCT URINE PREGNANCY  CERVICOVAGINAL ANCILLARY ONLY    EKG   Radiology No results found.  Procedures Procedures (including critical care time)  Medications Ordered in UC Medications - No data to display  Initial Impression / Assessment and Plan / UC Course  I have reviewed the triage vital signs and the nursing notes.  Pertinent labs & imaging results that were available during my care of the patient were reviewed by me and considered in my medical decision making (see chart for details).    Vaginal discharge, STD screening, negative pregnancy test.  Patient obtained vaginal self swab for testing.  Treating with MetroGel .  Discussed that we will call if test results are positive.  Discussed that she may require additional treatment at that time.  Discussed that sexual  partner(s) may also require treatment.  Instructed patient to abstain from sexual activity for at least 7 days.  Instructed her to follow-up with her PCP or gynecologist if her symptoms are not  improving.  Patient agrees to plan of care.   Final Clinical Impressions(s) / UC Diagnoses   Final diagnoses:  Vaginal discharge  Screening for STD (sexually transmitted disease)  Negative pregnancy test     Discharge Instructions      Use the MetroGel  as directed.    Your vaginal tests are pending.  If your test results are positive, we will call you.  You and your sexual partner(s) may require treatment at that time.  Do not have sexual activity for at least 7 days.    Follow-up with your primary care provider or gynecologist if your symptoms are not improving.       ED Prescriptions     Medication Sig Dispense Auth. Provider   metroNIDAZOLE  (METROGEL ) 0.75 % vaginal gel Place 1 Applicatorful vaginally at bedtime for 5 days. 50 g Corlis Burnard DEL, NP      PDMP not reviewed this encounter.   Corlis Burnard DEL, NP 07/24/23 [phone]

## 2023-07-24 NOTE — ED Triage Notes (Signed)
 Patient to Urgent Care with complaints of malodorous vaginal discharge x1 week. Denies any itching.   Hx of frequent BV.   Als requests STD testing.

## 2023-07-24 NOTE — Discharge Instructions (Addendum)
 Use the MetroGel  as directed.    Your vaginal tests are pending.  If your test results are positive, we will call you.  You and your sexual partner(s) may require treatment at that time.  Do not have sexual activity for at least 7 days.    Follow-up with your primary care provider or gynecologist if your symptoms are not improving.

## 2023-07-25 ENCOUNTER — Ambulatory Visit (HOSPITAL_COMMUNITY): Payer: Self-pay

## 2023-07-25 LAB — CERVICOVAGINAL ANCILLARY ONLY
Bacterial Vaginitis (gardnerella): POSITIVE — AB
Candida Glabrata: NEGATIVE
Candida Vaginitis: NEGATIVE
Chlamydia: NEGATIVE
Comment: NEGATIVE
Comment: NEGATIVE
Comment: NEGATIVE
Comment: NEGATIVE
Comment: NEGATIVE
Comment: NORMAL
Neisseria Gonorrhea: NEGATIVE
Trichomonas: NEGATIVE

## 2023-08-31 ENCOUNTER — Other Ambulatory Visit (HOSPITAL_BASED_OUTPATIENT_CLINIC_OR_DEPARTMENT_OTHER): Payer: Self-pay | Admitting: Family Medicine

## 2023-09-19 ENCOUNTER — Encounter (HOSPITAL_BASED_OUTPATIENT_CLINIC_OR_DEPARTMENT_OTHER): Payer: Self-pay | Admitting: Family Medicine

## 2023-09-19 ENCOUNTER — Other Ambulatory Visit (HOSPITAL_BASED_OUTPATIENT_CLINIC_OR_DEPARTMENT_OTHER): Payer: Self-pay | Admitting: Family Medicine

## 2023-09-19 ENCOUNTER — Encounter (INDEPENDENT_AMBULATORY_CARE_PROVIDER_SITE_OTHER): Payer: Self-pay | Admitting: Family Medicine

## 2023-09-19 DIAGNOSIS — B3731 Acute candidiasis of vulva and vagina: Secondary | ICD-10-CM | POA: Diagnosis not present

## 2023-09-19 MED ORDER — FLUCONAZOLE 150 MG PO TABS
150.0000 mg | ORAL_TABLET | Freq: Once | ORAL | 0 refills | Status: AC
Start: 2023-09-19 — End: 2023-09-19

## 2023-10-26 ENCOUNTER — Telehealth (HOSPITAL_BASED_OUTPATIENT_CLINIC_OR_DEPARTMENT_OTHER): Payer: Self-pay | Admitting: *Deleted

## 2023-10-26 NOTE — Telephone Encounter (Signed)
 Copied from CRM #[id number]. Topic: General - Call Back - No Documentation >> Oct 26, 2023  9:20 AM Joesph NOVAK wrote: Reason for CRM: patient says she missed a phone call.

## 2023-10-26 NOTE — Telephone Encounter (Signed)
 Pt has an upcoming appt 9/29 with Thersia.  Routing to Jenny and Kiana to see if one of them might have tried reaching out to pt.

## 2023-10-29 ENCOUNTER — Encounter (HOSPITAL_BASED_OUTPATIENT_CLINIC_OR_DEPARTMENT_OTHER): Payer: Self-pay | Admitting: Family Medicine

## 2023-10-29 ENCOUNTER — Ambulatory Visit (INDEPENDENT_AMBULATORY_CARE_PROVIDER_SITE_OTHER): Admitting: Family Medicine

## 2023-10-29 VITALS — BP 111/79 | HR 102 | Ht 60.0 in | Wt 145.7 lb

## 2023-10-29 DIAGNOSIS — E7800 Pure hypercholesterolemia, unspecified: Secondary | ICD-10-CM | POA: Diagnosis not present

## 2023-10-29 DIAGNOSIS — Z0000 Encounter for general adult medical examination without abnormal findings: Secondary | ICD-10-CM | POA: Diagnosis not present

## 2023-10-29 DIAGNOSIS — E1165 Type 2 diabetes mellitus with hyperglycemia: Secondary | ICD-10-CM | POA: Diagnosis not present

## 2023-10-29 NOTE — Patient Instructions (Addendum)
 Affordable Dental Services for Adults   [facility] [address] [place], [place] [address] [phone] Access provides dental services to  uninsured [facility] residents  who are enrolled in the [facility]. Services will  be limited to comprehensive  examinations, extractions, fillings, pain  management and some minor  restorative care. In order to qualify for  services, patients will be referred by  [facility]  [facility]) Safety Net Organizations that  already provide medical care to  residents with incomes between 0% and  200% of the federal poverty level.   Payment Options $40.00 per visit  (CASH ONLY)    [facility] Our Dental Clinic is open Monday through Thursday from 7:30 am - 4:00 pm. The dental clinic is available for adults and children with:  [place] Medicaid, [place] Health Choice, [facility], and Self-Pay. Uninsured patients can be seen on a sliding fee scale based on household income.    [facility] Community Service [facility] [address] [place], [place] [address] Phone [phone] Fax [phone]  Our clinic is open Monday through Friday 8:00 a.m. until 5:00 p.m, with the exception of Wednesday 10 a.m. to 5 p.m.  Medicaid and other insurance plans are welcome. Payment for services is due when services are rendered and may be made by cash or credit card.  If you have dental insurance, we will assist you with your claim submission   Ophthalmology Offices - please schedule eye appt   [facility]  [address]  [place], [place] [address] [phone]  [facility] [address] [place], [place] [address] Phone: [phone]  [facility] [address] [place], [place] [address] Phone: [phone]  [facility]   Optometrist [address]  [phone]  [facility] Optometrist [address]  [phone]

## 2023-10-29 NOTE — Progress Notes (Signed)
 Subjective:   Kathleen Barnes 2000/03/07  10/29/2023   CC: Chief Complaint  Patient presents with   Annual Exam    Pt is here today for her physical. Denies any concerns for today's visit.    HPI: Kathleen Barnes is a 23 y.o. female who presents for a routine health maintenance exam.  Labs collected at time of visit.   DIABETES MELLITUS: [name] presents for the medical management of diabetes.  Current diabetes medication regimen: Ozempic  0.5mg  (has not used in the past month due to cost)  Patient is  adhering to a diabetic diet.  Patient is  exercising regularly.  Patient is not checking BS regularly due to meter breaking.  Patient is  checking their feet regularly.  Denies polydipsia, polyphagia, polyuria, open wounds or ulcers on feet.   Lab Results  Component Value Date   HGBA1C 6.8 (H) 04/25/2023    Foot Exam: 12/14/2022 Lab Results  Component Value Date   LABMICR 7.9 12/14/2022   LABMICR Comment 04/24/2019    Wt Readings from Last 3 Encounters:  10/29/23 145 lb 11.2 oz (66.1 kg)  04/25/23 147 lb 9.6 oz (67 kg)  01/25/23 152 lb 12.8 oz (69.3 kg)    HEALTH SCREENINGS: - Vision Screening: Recommended - Dental Visits: Recommended - Pap smear: Due in October 2025; will establish with [facility]  - Breast Exam: Declined - STD Screening: Declined - Mammogram (40+): Not applicable  - Colonoscopy (45+): Not applicable  - Bone Density (65+ or under 65 with predisposing conditions): Not applicable  - Lung CA screening with low-dose CT:  Not applicable Adults age 26-80 who are current cigarette smokers or quit within the last 15 years. Must have 20 pack year history.   Depression and Anxiety Screen done today and results listed below:     10/29/2023    1:52 PM 04/25/2023    9:02 AM 01/25/2023   10:05 AM 12/14/2022   10:00 AM 12/10/2019   10:03 AM  Depression screen PHQ 2/9  Decreased Interest 0 0 0 1 0  Down, Depressed, Hopeless 0 0 0 0 0  PHQ - 2  Score 0 0 0 1 0  Altered sleeping 0 0 1  0  Tired, decreased energy 0 0 1  0  Change in appetite 0 0 0  0  Feeling bad or failure about yourself  0 0 0  0  Trouble concentrating 0 0 0  0  Moving slowly or fidgety/restless 0 0 0  0  Suicidal thoughts 0 0 0  0  PHQ-9 Score 0 0 2  0  Difficult doing work/chores Not difficult at all Not difficult at all Not difficult at all  Not difficult at all      10/29/2023    1:52 PM 04/25/2023    9:02 AM 01/25/2023   10:06 AM  GAD 7 : Generalized Anxiety Score  Nervous, Anxious, on Edge 0 0 0  Control/stop worrying 0 0 0  Worry too much - different things 0 0 0  Trouble relaxing 0 0 0  Restless 0 0 0  Easily annoyed or irritable 0 0 0  Afraid - awful might happen 0 0 0  Total GAD 7 Score 0 0 0  Anxiety Difficulty Not difficult at all Not difficult at all Not difficult at all    IMMUNIZATIONS: - Tdap: Tetanus vaccination status reviewed: last tetanus booster within 10 years. - HPV: Declined - Influenza: Refused - Pneumovax: Not  applicable - Prevnar 20: Not applicable - Shingrix (50+): Not applicable   Past medical history, surgical history, medications, allergies, family history and social history reviewed with patient today and changes made to appropriate areas of the chart.   Past Medical History:  Diagnosis Date   Abnormal glucose tolerance test (GTT) 09/12/2019   Acute blood loss anemia 11/08/2019   Echogenic intracardiac focus of fetus on prenatal ultrasound 07/17/2019   Gestational diabetes    High risk teen pregnancy, antepartum 05/16/2019   History of chlamydia infection 10/16/2017   Marginal insertion of umbilical cord affecting management of mother 07/17/2019   Maternal varicella, non-immune 05/16/2019   Normal labor 11/04/2019   PPH (postpartum hemorrhage) 11/04/2019    Past Surgical History:  Procedure Laterality Date   EMBOLIZATION (CATH LAB) N/A 11/05/2019   Procedure: EMBOLIZATION;  Surgeon: Marea Selinda RAMAN, MD;   Location: [facility] INVASIVE CV LAB;  Service: Cardiovascular;  Laterality: N/A;    Current Outpatient Medications on File Prior to Visit  Medication Sig   Blood Glucose Monitoring Suppl DEVI 1 each by Does not apply route in the morning, at noon, and at bedtime. May substitute to any manufacturer covered by patient's insurance.   Cholecalciferol  (VITAMIN D3) 1.25 MG (50000 UT) CAPS TAKE 1 CAPSULE BY MOUTH ONE TIME PER WEEK   Glucose Blood (BLOOD GLUCOSE TEST STRIPS) STRP 1 each by In Vitro route in the morning, at noon, and at bedtime. May substitute to any manufacturer covered by patient's insurance.   No current facility-administered medications on file prior to visit.    No Known Allergies   Social History   Socioeconomic History   Marital status: Single    Spouse name: Not on file   Number of children: Not on file   Years of education: Not on file   Highest education level: GED or equivalent  Occupational History   Not on file  Tobacco Use   Smoking status: Never    Passive exposure: Never   Smokeless tobacco: Never  Vaping Use   Vaping status: Every Day   Substances: Nicotine   Substance and Sexual Activity   Alcohol use: Yes    Comment: soc   Drug use: No   Sexual activity: Yes    Birth control/protection: None  Other Topics Concern   Not on file  Social History Narrative   Not on file   Social Drivers of Health   Financial Resource Strain: Medium Risk (01/24/2023)   Overall Financial Resource Strain (CARDIA)    Difficulty of Paying Living Expenses: Somewhat hard  Food Insecurity: Patient Declined (01/24/2023)   Hunger Vital Sign    Worried About Running Out of Food in the Last Year: Patient declined    Ran Out of Food in the Last Year: Patient declined  Transportation Needs: No Transportation Needs (01/24/2023)   PRAPARE - Administrator, Civil Service (Medical): No    Lack of Transportation (Non-Medical): No  Physical Activity: Insufficiently  Active (01/24/2023)   Exercise Vital Sign    Days of Exercise per Week: 1 day    Minutes of Exercise per Session: 130 min  Stress: Stress Concern Present (01/24/2023)   [employer] of Occupational Health - Occupational Stress Questionnaire    Feeling of Stress : To some extent  Social Connections: Moderately Isolated (01/24/2023)   Social Connection and Isolation Panel    Frequency of Communication with Friends and Family: More than three times a week    Frequency of Social  Gatherings with Friends and Family: More than three times a week    Attends Religious Services: More than 4 times per year    Active Member of [employer] or Organizations: No    Attends Engineer, structural: Not on file    Marital Status: Never married  Catering manager Violence: Not on file   Social History   Tobacco Use  Smoking Status Never   Passive exposure: Never  Smokeless Tobacco Never   Social History   Substance and Sexual Activity  Alcohol Use Yes   Comment: soc    Family History  Problem Relation Age of Onset   Hypertension Mother    Healthy Father    Heart attack Maternal Grandmother    Migraines Maternal Grandmother    Breast cancer Neg Hx    Ovarian cancer Neg Hx    Colon cancer Neg Hx      ROS: Denies fever, fatigue, unexplained weight loss/gain, chest pain, SHOB, and palpitations. Denies neurological deficits, gastrointestinal or genitourinary complaints, and skin changes.   Objective:   Today's Vitals   10/29/23 1350  BP: 111/79  Pulse: (!) 102  SpO2: 100%  Weight: 145 lb 11.2 oz (66.1 kg)  Height: 5' (1.524 m)    GENERAL APPEARANCE: Well-appearing, in NAD. Well nourished.  SKIN: Pink, warm and dry. Turgor normal. No rash, lesion, ulceration, or ecchymoses. Hair evenly distributed.  HEENT: HEAD: Normocephalic.  EYES: PERRLA. EOMI. Lids intact w/o defect. Sclera white, Conjunctiva pink w/o exudate.  EARS: External ear w/o redness, swelling, masses or lesions.  EAC clear. TM's intact, translucent w/o bulging, appropriate landmarks visualized. Appropriate acuity to conversational tones.  NOSE: Septum midline w/o deformity. Nares patent, mucosa pink and non-inflamed w/o drainage. No sinus tenderness.  THROAT: Uvula midline. Oropharynx clear. Tonsils non-inflamed w/o exudate. Oral mucosa pink and moist.  NECK: Supple, Trachea midline. Full ROM w/o pain or tenderness. No lymphadenopathy. Thyroid non-tender w/o enlargement or palpable masses.  RESPIRATORY: Chest wall symmetrical w/o masses. Respirations even and non-labored. Breath sounds clear to auscultation bilaterally. No wheezes, rales, rhonchi, or crackles. CARDIAC: S1, S2 present, regular rate and rhythm. No gallops, murmurs, rubs, or clicks. PMI w/o lifts, heaves, or thrills. No carotid bruits. Capillary refill <2 seconds. Peripheral pulses 2+ bilaterally. GI: Abdomen soft w/o distention. Normoactive bowel sounds. No palpable masses or tenderness. No guarding or rebound tenderness. Liver and spleen w/o tenderness or enlargement. No CVA tenderness.  MSK: Muscle tone and strength appropriate for age, w/o atrophy or abnormal movement.  EXTREMITIES: Active ROM intact, w/o tenderness, crepitus, or contracture. No obvious joint deformities or effusions. No clubbing, edema, or cyanosis.  NEUROLOGIC: CN's II-XII intact. Motor strength symmetrical with no obvious weakness. No sensory deficits. DTR's 2+ symmetric bilaterally. Steady, even gait.  PSYCH/MENTAL STATUS: Alert, oriented x 3. Cooperative, appropriate mood and affect.   Results for orders placed or performed during the hospital encounter of 07/24/23  POCT urine pregnancy   Collection Time: 07/24/23  6:15 PM  Result Value Ref Range   Preg Test, Ur Negative Negative  Cervicovaginal ancillary only   Collection Time: 07/24/23  6:25 PM  Result Value Ref Range   Neisseria Gonorrhea Negative    Chlamydia Negative    Trichomonas Negative    Bacterial  Vaginitis (gardnerella) Positive (A)    Candida Vaginitis Negative    Candida Glabrata Negative    Comment      Normal Reference Range Bacterial Vaginosis - Negative   Comment Normal Reference Range Candida  Species - Negative    Comment Normal Reference Range Candida Galbrata - Negative    Comment Normal Reference Range Trichomonas - Negative    Comment Normal Reference Ranger Chlamydia - Negative    Comment      Normal Reference Range Neisseria Gonorrhea - Negative    Assessment & Plan:  1. Annual physical exam (Primary) Discussed preventative screenings, vaccines, and healthy lifestyle with patient. Referral placed to [facility] per patient request to establish and complete Pap smear. Labs fasting obtained today. Recommended HPV vaccines. Patient will consider. Dental resources provided.   - CBC with Differential/Platelet - Comprehensive metabolic panel with GFR - TSH - Ambulatory referral to Obstetrics / Gynecology  2. Type 2 diabetes mellitus with hyperglycemia, without long-term current use of insulin  (HCC) Previously well controlled with Ozempic  0.5mg  weekly. She has not taken for 1 month due to cost. Will check A1C today and consider restarting metformin  pending labs. Ophthalmology visit recommended with resources.  - Hemoglobin A1c  3. Elevated cholesterol Discussed diet and lifestyle changes. Will check fasting LP with labs today.  - Lipid panel   Orders Placed This Encounter  Procedures   CBC with Differential/Platelet   Comprehensive metabolic panel with GFR   Lipid panel   TSH   Hemoglobin A1c   Ambulatory referral to Obstetrics / Gynecology    Referral Priority:   Routine    Referral Type:   Consultation    Referral Reason:   Specialty Services Required    Requested Specialty:   Obstetrics and Gynecology    Number of Visits Requested:   1    PATIENT COUNSELING:  - Encouraged a healthy well-balanced diet. Patient may adjust caloric intake to maintain or  achieve ideal body weight. May reduce intake of dietary saturated fat and total fat and have adequate dietary potassium and calcium  preferably from fresh fruits, vegetables, and low-fat dairy products.   - Advised to avoid cigarette smoking. - Discussed with the patient that most people either abstain from alcohol or drink within safe limits (<=14/week and <=4 drinks/occasion for males, <=7/weeks and <= 3 drinks/occasion for females) and that the risk for alcohol disorders and other health effects rises proportionally with the number of drinks per week and how often a drinker exceeds daily limits. - Discussed cessation/primary prevention of drug use and availability of treatment for abuse.  - Discussed sexually transmitted diseases, avoidance of unintended pregnancy and contraceptive alternatives.  - Stressed the importance of regular exercise - Injury prevention: Discussed safety belts, safety helmets, smoke detector, smoking near bedding or upholstery.  - Dental health: Discussed importance of regular tooth brushing, flossing, and dental visits.   NEXT PREVENTATIVE PHYSICAL DUE IN 1 YEAR.  Return in about 6 months (around 04/27/2024) for DIABETES CHECK UP.  Patient to reach out to office if new, worrisome, or unresolved symptoms arise or if no improvement in patient's condition. Patient verbalized understanding and is agreeable to treatment plan. All questions answered to patient's satisfaction.    Thersia Schuyler Stark, [place]

## 2023-10-30 LAB — CBC WITH DIFFERENTIAL/PLATELET
Basophils Absolute: 0 x10E3/uL (ref 0.0–0.2)
Basos: 1 %
EOS (ABSOLUTE): 0 x10E3/uL (ref 0.0–0.4)
Eos: 0 %
Hematocrit: 41.7 % (ref 34.0–46.6)
Hemoglobin: 13.5 g/dL (ref 11.1–15.9)
Immature Grans (Abs): 0 x10E3/uL (ref 0.0–0.1)
Immature Granulocytes: 0 %
Lymphocytes Absolute: 1.5 x10E3/uL (ref 0.7–3.1)
Lymphs: 42 %
MCH: 28 pg (ref 26.6–33.0)
MCHC: 32.4 g/dL (ref 31.5–35.7)
MCV: 86 fL (ref 79–97)
Monocytes Absolute: 0.4 x10E3/uL (ref 0.1–0.9)
Monocytes: 13 %
Neutrophils Absolute: 1.6 x10E3/uL (ref 1.4–7.0)
Neutrophils: 44 %
Platelets: 278 x10E3/uL (ref 150–450)
RBC: 4.83 x10E6/uL (ref 3.77–5.28)
RDW: 13.1 % (ref 11.7–15.4)
WBC: 3.5 x10E3/uL (ref 3.4–10.8)

## 2023-10-30 LAB — COMPREHENSIVE METABOLIC PANEL WITH GFR
ALT: 6 IU/L (ref 0–32)
AST: 15 IU/L (ref 0–40)
Albumin: 4.3 g/dL (ref 4.0–5.0)
Alkaline Phosphatase: 70 IU/L (ref 41–116)
BUN/Creatinine Ratio: 12 (ref 9–23)
BUN: 7 mg/dL (ref 6–20)
Bilirubin Total: 0.5 mg/dL (ref 0.0–1.2)
CO2: 19 mmol/L — ABNORMAL LOW (ref 20–29)
Calcium: 9.2 mg/dL (ref 8.7–10.2)
Chloride: 100 mmol/L (ref 96–106)
Creatinine, Ser: 0.58 mg/dL (ref 0.57–1.00)
Globulin, Total: 2.7 g/dL (ref 1.5–4.5)
Glucose: 186 mg/dL — ABNORMAL HIGH (ref 70–99)
Potassium: 4.2 mmol/L (ref 3.5–5.2)
Sodium: 137 mmol/L (ref 134–144)
Total Protein: 7 g/dL (ref 6.0–8.5)
eGFR: 130 mL/min/1.73 (ref >59)

## 2023-10-30 LAB — LIPID PANEL
Chol/HDL Ratio: 5.1 ratio — ABNORMAL HIGH (ref 0.0–4.4)
Cholesterol, Total: 205 mg/dL — ABNORMAL HIGH (ref 100–199)
HDL: 40 mg/dL (ref >39)
LDL Chol Calc (NIH): 144 mg/dL — ABNORMAL HIGH (ref 0–99)
Triglycerides: 115 mg/dL (ref 0–149)
VLDL Cholesterol Cal: 21 mg/dL (ref 5–40)

## 2023-10-30 LAB — TSH: TSH: 0.932 u[IU]/mL (ref 0.450–4.500)

## 2023-10-30 LAB — HEMOGLOBIN A1C
Est. average glucose Bld gHb Est-mCnc: 243 mg/dL
Hgb A1c MFr Bld: 10.1 % — ABNORMAL HIGH (ref 4.8–5.6)

## 2023-10-31 ENCOUNTER — Other Ambulatory Visit (HOSPITAL_BASED_OUTPATIENT_CLINIC_OR_DEPARTMENT_OTHER): Payer: Self-pay | Admitting: Family Medicine

## 2023-10-31 ENCOUNTER — Ambulatory Visit (HOSPITAL_BASED_OUTPATIENT_CLINIC_OR_DEPARTMENT_OTHER): Payer: Self-pay | Admitting: Family Medicine

## 2023-10-31 NOTE — Progress Notes (Signed)
 Hi Nimah,  Your electrolytes, kidney and liver function are stable.  However, your cholesterol and your A1c have become uncontrolled.  Your A1c is up to 10.1.  Your thyroid function was stable and your blood counts are stable.  At this time, I would recommend we either restart Ozempic  or Mounjaro.  I can send 1 of these in to see if this would be covered and depending on the cost, if it is affordable, we would start on this and increase your dosage after 4 weeks.  Please let me know if you are agreeable to this.  If not, there are other oral medications we can use instead.

## 2023-11-25 ENCOUNTER — Encounter (HOSPITAL_BASED_OUTPATIENT_CLINIC_OR_DEPARTMENT_OTHER): Payer: Self-pay | Admitting: Family Medicine

## 2024-04-28 ENCOUNTER — Ambulatory Visit (HOSPITAL_BASED_OUTPATIENT_CLINIC_OR_DEPARTMENT_OTHER): Admitting: Family Medicine
